# Patient Record
Sex: Female | Born: 1935 | Race: White | Hispanic: No | Smoking: Never smoker
Health system: Southern US, Community
[De-identification: ages and names within clinical notes are randomized; demographics above are authoritative.]

## PROBLEM LIST (undated history)

## (undated) DIAGNOSIS — E785 Hyperlipidemia, unspecified: Secondary | ICD-10-CM

## (undated) DIAGNOSIS — K635 Polyp of colon: Secondary | ICD-10-CM

## (undated) DIAGNOSIS — H269 Unspecified cataract: Secondary | ICD-10-CM

## (undated) DIAGNOSIS — F32A Depression, unspecified: Secondary | ICD-10-CM

## (undated) DIAGNOSIS — F209 Schizophrenia, unspecified: Secondary | ICD-10-CM

## (undated) DIAGNOSIS — E669 Obesity, unspecified: Secondary | ICD-10-CM

## (undated) DIAGNOSIS — N393 Stress incontinence (female) (male): Secondary | ICD-10-CM

## (undated) DIAGNOSIS — C801 Malignant (primary) neoplasm, unspecified: Secondary | ICD-10-CM

## (undated) DIAGNOSIS — R319 Hematuria, unspecified: Secondary | ICD-10-CM

## (undated) DIAGNOSIS — C4490 Unspecified malignant neoplasm of skin, unspecified: Secondary | ICD-10-CM

## (undated) DIAGNOSIS — M1990 Unspecified osteoarthritis, unspecified site: Secondary | ICD-10-CM

## (undated) DIAGNOSIS — I10 Essential (primary) hypertension: Secondary | ICD-10-CM

## (undated) DIAGNOSIS — F329 Major depressive disorder, single episode, unspecified: Secondary | ICD-10-CM

## (undated) DIAGNOSIS — Z9889 Other specified postprocedural states: Secondary | ICD-10-CM

## (undated) DIAGNOSIS — F419 Anxiety disorder, unspecified: Secondary | ICD-10-CM

## (undated) DIAGNOSIS — Z973 Presence of spectacles and contact lenses: Secondary | ICD-10-CM

## (undated) DIAGNOSIS — C50919 Malignant neoplasm of unspecified site of unspecified female breast: Secondary | ICD-10-CM

## (undated) DIAGNOSIS — N179 Acute kidney failure, unspecified: Secondary | ICD-10-CM

## (undated) DIAGNOSIS — K219 Gastro-esophageal reflux disease without esophagitis: Secondary | ICD-10-CM

## (undated) DIAGNOSIS — R32 Unspecified urinary incontinence: Secondary | ICD-10-CM

## (undated) DIAGNOSIS — Z9289 Personal history of other medical treatment: Secondary | ICD-10-CM

## (undated) HISTORY — DX: Unspecified malignant neoplasm of skin, unspecified: C44.90

## (undated) HISTORY — PX: TONSILLECTOMY: SUR1361

## (undated) HISTORY — DX: Malignant (primary) neoplasm, unspecified: C80.1

## (undated) HISTORY — DX: Stress incontinence (female) (male): N39.3

## (undated) HISTORY — PX: COLONOSCOPY: SHX174

## (undated) HISTORY — DX: Gastro-esophageal reflux disease without esophagitis: K21.9

## (undated) HISTORY — DX: Essential (primary) hypertension: I10

## (undated) HISTORY — DX: Hyperlipidemia, unspecified: E78.5

## (undated) HISTORY — DX: Unspecified urinary incontinence: R32

## (undated) HISTORY — DX: Unspecified cataract: H26.9

## (undated) HISTORY — PX: BREAST SURGERY: SHX581

## (undated) HISTORY — PX: SPIDER VEIN INJECTION: SHX783

## (undated) HISTORY — PX: TUBAL LIGATION: SHX77

## (undated) HISTORY — DX: Other specified postprocedural states: Z98.890

## (undated) HISTORY — DX: Malignant neoplasm of unspecified site of unspecified female breast: C50.919

## (undated) HISTORY — DX: Hematuria, unspecified: R31.9

## (undated) HISTORY — DX: Unspecified osteoarthritis, unspecified site: M19.90

## (undated) HISTORY — DX: Obesity, unspecified: E66.9

## (undated) HISTORY — DX: Polyp of colon: K63.5

## (undated) HISTORY — PX: APPENDECTOMY: SHX54

## (undated) HISTORY — DX: Presence of spectacles and contact lenses: Z97.3

## (undated) HISTORY — DX: Personal history of other medical treatment: Z92.89

## (undated) HISTORY — PX: TONSILECTOMY, ADENOIDECTOMY, BILATERAL MYRINGOTOMY AND TUBES: SHX2538

## (undated) HISTORY — DX: Schizophrenia, unspecified: F20.9

---

## 1999-08-10 ENCOUNTER — Other Ambulatory Visit: Admission: RE | Admit: 1999-08-10 | Discharge: 1999-08-10 | Payer: Self-pay | Admitting: Internal Medicine

## 1999-09-01 ENCOUNTER — Encounter: Admission: RE | Admit: 1999-09-01 | Discharge: 1999-09-01 | Payer: Self-pay | Admitting: Internal Medicine

## 1999-09-01 ENCOUNTER — Encounter: Payer: Self-pay | Admitting: Internal Medicine

## 2000-08-07 ENCOUNTER — Other Ambulatory Visit: Admission: RE | Admit: 2000-08-07 | Discharge: 2000-08-07 | Payer: Self-pay | Admitting: Family Medicine

## 2001-07-27 ENCOUNTER — Encounter: Payer: Self-pay | Admitting: Family Medicine

## 2001-07-27 ENCOUNTER — Encounter: Admission: RE | Admit: 2001-07-27 | Discharge: 2001-07-27 | Payer: Self-pay | Admitting: Family Medicine

## 2001-08-07 ENCOUNTER — Other Ambulatory Visit: Admission: RE | Admit: 2001-08-07 | Discharge: 2001-08-07 | Payer: Self-pay | Admitting: Family Medicine

## 2002-07-29 ENCOUNTER — Encounter: Payer: Self-pay | Admitting: Family Medicine

## 2002-07-29 ENCOUNTER — Encounter: Admission: RE | Admit: 2002-07-29 | Discharge: 2002-07-29 | Payer: Self-pay | Admitting: Family Medicine

## 2002-07-31 ENCOUNTER — Ambulatory Visit (HOSPITAL_COMMUNITY): Admission: RE | Admit: 2002-07-31 | Discharge: 2002-07-31 | Payer: Self-pay | Admitting: Gastroenterology

## 2002-07-31 ENCOUNTER — Encounter (INDEPENDENT_AMBULATORY_CARE_PROVIDER_SITE_OTHER): Payer: Self-pay | Admitting: *Deleted

## 2002-08-14 ENCOUNTER — Other Ambulatory Visit: Admission: RE | Admit: 2002-08-14 | Discharge: 2002-08-14 | Payer: Self-pay | Admitting: Family Medicine

## 2003-09-18 ENCOUNTER — Encounter: Admission: RE | Admit: 2003-09-18 | Discharge: 2003-09-18 | Payer: Self-pay | Admitting: Family Medicine

## 2003-12-15 ENCOUNTER — Other Ambulatory Visit: Admission: RE | Admit: 2003-12-15 | Discharge: 2003-12-15 | Payer: Self-pay | Admitting: Family Medicine

## 2007-02-01 ENCOUNTER — Other Ambulatory Visit: Admission: RE | Admit: 2007-02-01 | Discharge: 2007-02-01 | Payer: Self-pay | Admitting: Family Medicine

## 2007-07-02 ENCOUNTER — Encounter: Admission: RE | Admit: 2007-07-02 | Discharge: 2007-07-02 | Payer: Self-pay | Admitting: *Deleted

## 2010-03-02 ENCOUNTER — Other Ambulatory Visit: Admission: RE | Admit: 2010-03-02 | Discharge: 2010-03-02 | Payer: Self-pay | Admitting: Family Medicine

## 2010-10-03 DIAGNOSIS — Z9889 Other specified postprocedural states: Secondary | ICD-10-CM

## 2010-10-03 HISTORY — DX: Other specified postprocedural states: Z98.890

## 2011-02-18 NOTE — Op Note (Signed)
NAME:  Lorraine Murphy, Lorraine Murphy         ACCOUNT NO.:  [id number]   MEDICAL RECORD NO.:  [id number]                   PATIENT TYPE:  AMB   LOCATION:  ENDO                                 FACILITY:  [facility]   PHYSICIAN:  Bernette Redbird, MD                  DATE OF BIRTH:  1934/11/04   DATE OF PROCEDURE:  07/31/2002  DATE OF DISCHARGE:                                 OPERATIVE REPORT   PROCEDURE:  Colonoscopy with biopsy.   INDICATION:  Follow-up of colon adenoma removed five years ago in a 76-year-  old female.   FINDINGS:  Diminutive polyp above the cecum.   DESCRIPTION OF PROCEDURE:  The nature, purpose, and risks of the procedure  had previously been discussed with the patient from her prior procedure, and  the provided written consent.  Sedation was fentanyl 75 mcg and Versed 8 mg  IV without arrhythmias or desaturation.  The procedure was initiated with  the Olympus pediatric video colonoscope, but this could not be advanced  beyond the level of the ileocecal valve due to looping, so we took the scope  out, looking as we came, observing no abnormalities, and the patient was  recolonoscoped using the adult video colonoscope which, with the patient in  the supine position and a fair amount of effort, was finally able to reach  the base of the cecum with complete visualization of the appendiceal orifice  and the base of the cecum.  Pullback was then performed.  The quality of the  prep was excellent, and it is felt that essentially all areas were well  seen.  There was a little bit of particulate stool debris here and there,  but it is not felt that any significant lesions would have been missed.   There was a sessile 2 mm polyp on the fold just above the cecum removed by a  single cold biopsy.  No other polyps were seen, and there was no evidence of  cancer, colitis, vascular malformations, or diverticular disease.  Retroflexion in the rectum was unremarkable, and the  patient tolerated the  procedure well.  There were no apparent complications.   IMPRESSION:  Solitary diminutive sessile polyp, removed as described above.  Otherwise normal exam in a patient with a prior history of colonic adenomas.   PLAN:  Await pathology on the polyp.  Anticipate colonoscopic reevaluation  in five years in view of the prior history of a colonic adenoma having been  removed.                                               Bernette Redbird, MD    RB/MEDQ  D:  07/31/2002  T:  07/31/2002  Job:  [id number]   cc:   Raynelle Dick, M.D.  [address]  [place]  [place] [address]  Fax: [phone]

## 2012-02-09 ENCOUNTER — Other Ambulatory Visit: Payer: Self-pay | Admitting: Radiology

## 2012-02-13 ENCOUNTER — Other Ambulatory Visit: Payer: Self-pay | Admitting: Radiology

## 2012-02-13 DIAGNOSIS — C50911 Malignant neoplasm of unspecified site of right female breast: Secondary | ICD-10-CM

## 2012-02-16 ENCOUNTER — Telehealth: Payer: Self-pay | Admitting: *Deleted

## 2012-02-16 ENCOUNTER — Other Ambulatory Visit: Payer: Self-pay | Admitting: *Deleted

## 2012-02-16 DIAGNOSIS — C50419 Malignant neoplasm of upper-outer quadrant of unspecified female breast: Secondary | ICD-10-CM

## 2012-02-16 DIAGNOSIS — C50411 Malignant neoplasm of upper-outer quadrant of right female breast: Secondary | ICD-10-CM | POA: Insufficient documentation

## 2012-02-16 NOTE — Telephone Encounter (Signed)
Confirmed [facility] for 02/22/12 at 1200 .  Instructions and contact information given.  

## 2012-02-17 ENCOUNTER — Ambulatory Visit
Admission: RE | Admit: 2012-02-17 | Discharge: 2012-02-17 | Disposition: A | Discharge status: Home / Self Care | Payer: Medicare Other | Source: Ambulatory Visit

## 2012-02-17 ENCOUNTER — Ambulatory Visit
Admission: RE | Admit: 2012-02-17 | Discharge: 2012-02-17 | Disposition: A | Discharge status: Home / Self Care | Payer: Medicare Other | Source: Ambulatory Visit | Attending: Radiology | Admitting: Radiology

## 2012-02-17 DIAGNOSIS — C50911 Malignant neoplasm of unspecified site of right female breast: Secondary | ICD-10-CM

## 2012-02-17 MED ORDER — GADOBENATE DIMEGLUMINE 529 MG/ML IV SOLN
20.0000 mL | Freq: Once | INTRAVENOUS | Status: AC | PRN
  Administered 2012-02-17: 20 mL via INTRAVENOUS

## 2012-02-22 ENCOUNTER — Encounter (INDEPENDENT_AMBULATORY_CARE_PROVIDER_SITE_OTHER): Payer: Self-pay | Admitting: General Surgery

## 2012-02-22 ENCOUNTER — Encounter: Payer: Self-pay | Admitting: Specialist

## 2012-02-22 ENCOUNTER — Ambulatory Visit
Admission: RE | Admit: 2012-02-22 | Discharge: 2012-02-22 | Disposition: A | Discharge status: Home / Self Care | Payer: Medicare Other | Source: Ambulatory Visit | Attending: Radiation Oncology | Admitting: Radiation Oncology

## 2012-02-22 ENCOUNTER — Other Ambulatory Visit (HOSPITAL_BASED_OUTPATIENT_CLINIC_OR_DEPARTMENT_OTHER): Payer: Medicare Other

## 2012-02-22 ENCOUNTER — Ambulatory Visit (HOSPITAL_BASED_OUTPATIENT_CLINIC_OR_DEPARTMENT_OTHER): Payer: Medicare Other | Admitting: General Surgery

## 2012-02-22 ENCOUNTER — Ambulatory Visit: Payer: Medicare Other

## 2012-02-22 ENCOUNTER — Ambulatory Visit (HOSPITAL_BASED_OUTPATIENT_CLINIC_OR_DEPARTMENT_OTHER): Payer: Medicare Other | Admitting: Oncology

## 2012-02-22 ENCOUNTER — Encounter: Payer: Self-pay | Admitting: *Deleted

## 2012-02-22 ENCOUNTER — Encounter: Payer: Self-pay | Admitting: Oncology

## 2012-02-22 ENCOUNTER — Ambulatory Visit: Payer: Medicare Other | Attending: General Surgery | Admitting: Physical Therapy

## 2012-02-22 VITALS — BP 133/81 | HR 71 | Temp 98.6°F | Ht 63.5 in | Wt 246.9 lb

## 2012-02-22 DIAGNOSIS — C50419 Malignant neoplasm of upper-outer quadrant of unspecified female breast: Secondary | ICD-10-CM

## 2012-02-22 DIAGNOSIS — C50919 Malignant neoplasm of unspecified site of unspecified female breast: Secondary | ICD-10-CM

## 2012-02-22 DIAGNOSIS — IMO0001 Reserved for inherently not codable concepts without codable children: Secondary | ICD-10-CM | POA: Insufficient documentation

## 2012-02-22 DIAGNOSIS — R293 Abnormal posture: Secondary | ICD-10-CM | POA: Insufficient documentation

## 2012-02-22 LAB — COMPREHENSIVE METABOLIC PANEL
ALT: 19 U/L (ref 0–35)
Albumin: 3.7 g/dL (ref 3.5–5.2)
Alkaline Phosphatase: 89 U/L (ref 39–117)
Glucose, Bld: 119 mg/dL — ABNORMAL HIGH (ref 70–99)
Potassium: 3.8 mEq/L (ref 3.5–5.3)
Sodium: 139 mEq/L (ref 135–145)
Total Bilirubin: 0.2 mg/dL — ABNORMAL LOW (ref 0.3–1.2)
Total Protein: 6.8 g/dL (ref 6.0–8.3)

## 2012-02-22 LAB — CBC WITH DIFFERENTIAL/PLATELET
Basophils Absolute: 0 10*3/uL (ref 0.0–0.1)
EOS%: 3.5 % (ref 0.0–7.0)
Eosinophils Absolute: 0.3 10*3/uL (ref 0.0–0.5)
HGB: 12.6 g/dL (ref 11.6–15.9)
MCH: 28.2 pg (ref 25.1–34.0)
MCV: 87.4 fL (ref 79.5–101.0)
MONO%: 9.2 % (ref 0.0–14.0)
NEUT#: 4.5 10*3/uL (ref 1.5–6.5)
RBC: 4.46 10*6/uL (ref 3.70–5.45)
RDW: 15.7 % — ABNORMAL HIGH (ref 11.2–14.5)
lymph#: 3.1 10*3/uL (ref 0.9–3.3)
nRBC: 0 % (ref 0–0)

## 2012-02-22 LAB — CANCER ANTIGEN 27.29: CA 27.29: 38 U/mL (ref 0–39)

## 2012-02-22 NOTE — Progress Notes (Signed)
ID: Lorraine Murphy   DOB: 1935-03-02  MR#: [id number]  HQI#:[id number]  HISTORY OF PRESENT ILLNESS: Lorraine Murphy (pronoiunced "Goin") palpated a mass in her right breast about a week before routine mammography was due 02/07/2012. She mentioned this at [facility] so her screening mammography was changed to diagnostic, and this showed a small well circumscribed mass in the right breast, without associated microcalcifications. This was palpable and mobile by exam. Ultrasound showed a hypoechoic lobulated mass measuring 1.0 cm. Core biopsy was performed May 9 and showed ([id number]) and invasive ductal carcinoma, grade 1, with a prognostic panel pending. Bilateral breast MRI was performed 02/17/2012 and confirmed an enhancing irregular mass measuring 1.3 cm in the right breast, with a 0.6 cm mass medial to that. There was no enlarged axillary or internal mammary adenopathy. The patient subsequent history is as detailed below.  INTERVAL HISTORY: Lorraine Murphy presents today to the multidisciplinary breast cancer clinic with her sons Chrissie Noa and Molli Hazard for a Toni Arthurs discussion of her situation and a definitive treatment plan.  REVIEW OF SYSTEMS: She reports no symptoms suggestive of metastatic disease, and in particular her functional status has been very stable. She complains of poor circulation, but denies chest pain or pressure. She has chronic urinary incontinence. She has arthritis involving several joints, and this makes it difficult for her to walk long distances, but none of this is new or more intense or persistent than previously. Overall a detailed review of systems today was noncontributory  PAST MEDICAL HISTORY: Past Medical History  Diagnosis Date  . Hypertension   . Breast cancer   . Diabetes mellitus   . Wears glasses   . H/O colonoscopy 2012  . H/O bone density study   . Incontinence   . Arthritis   . Skin cancer   . Schizophrenia   . GERD (gastroesophageal reflux disease)   . Obesity   .  Osteoarthritis   . Bilateral cataracts     PAST SURGICAL HISTORY: Past Surgical History  Procedure Date  . Appendectomy   . Tonsilectomy, adenoidectomy, bilateral myringotomy and tubes   . Tubal ligation   . Spider vein injection     FAMILY HISTORY Family History  Problem Relation Age of Onset  . Lung cancer Father    the patient's father died at the age of 74, shortly after being diagnosed with lung cancer, in the setting of tobacco abuse. The patient's mother died at the age of 37, possibly from heart disease. The patient had 2 brothers. One of them died in infancy. Of the other has a history of skin cancers, not melanoma. The patient had one sister who died from complications of emphysema. There is no history of breast or ovarian cancer in the family.  GYNECOLOGIC HISTORY: Menarche age 26, first live birth age 48, she is GX P2, went through menopause approximately age 56, and used hormone replacement for 10-15 years, stopping several years ago.   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 She is divorced and lives at home with her beagle "Cossey ." Her some [name] is a IT trainer and lives in [place]. Son [name] is a district at [employer] in [place]. The patient is a [employer]. She has 1 grandchild  ADVANCED DIRECTIVES: No advanced directives in place.  HEALTH MAINTENANCE: History  Substance Use Topics  . Smoking status: Never Smoker   . Smokeless tobacco: Not on file  . Alcohol Use: Yes  Colonoscopy: 2012  PAP: 2010?  Bone density: remote, "good"  Lipid panel:  No Known Allergies  Current Outpatient Prescriptions  Medication Sig Dispense Refill  . amLODipine (NORVASC) 5 MG tablet Take 5 mg by mouth daily.      Marland Kitchen aspirin 81 MG tablet Take 81 mg by mouth daily.      Marland Kitchen atorvastatin (LIPITOR) 20 MG tablet       . fish oil-omega-3 fatty acids 1000 MG capsule Take 2 g by mouth daily.      .  haloperidol decanoate (HALDOL DECANOATE) 50 MG/ML injection       . losartan-hydrochlorothiazide (HYZAAR) 50-12.5 MG per tablet       . Multiple Vitamin (MULTIVITAMIN) tablet Take 1 tablet by mouth daily.        OBJECTIVE: Middle-aged white woman in no acute distress Filed Vitals:   02/22/12 1308  BP: 133/81  Pulse: 71  Temp: 98.6 F (37 C)     Body mass index is 43.05 kg/(m^2).    ECOG FS: 1  Sclerae unicteric Oropharynx clear No cervical or supraclavicular adenopathy Lungs no rales or rhonchi Heart regular rate and rhythm Abd benign MSK no focal spinal tenderness Neuro: nonfocal; normal affect, alert and oriented x3 Breasts: The right breast is status post recent biopsy, and there is a small ecchymosis related to this. The mass is superficial in the right breast, near the nipple, easily movable, without other skin or nipple changes. The left breast is unremarkable.  LAB RESULTS: Lab Results  Component Value Date   WBC 8.8 02/22/2012   NEUTROABS 4.5 02/22/2012   HGB 12.6 02/22/2012   HCT 39.0 02/22/2012   MCV 87.4 02/22/2012   PLT 335 02/22/2012      Chemistry      Component Value Date/Time   NA 139 02/22/2012 1255   K 3.8 02/22/2012 1255   CL 103 02/22/2012 1255   CO2 27 02/22/2012 1255   BUN 18 02/22/2012 1255   CREATININE 0.92 02/22/2012 1255      Component Value Date/Time   CALCIUM 10.0 02/22/2012 1255   ALKPHOS 89 02/22/2012 1255   AST 20 02/22/2012 1255   ALT 19 02/22/2012 1255   BILITOT 0.2* 02/22/2012 1255       No results found for this basename: LABCA2    No components found with this basename: LABCA125    No results found for this basename: INR:1;PROTIME:1 in the last 168 hours  Urinalysis No results found for this basename: colorurine,  appearanceur,  labspec,  phurine,  glucoseu,  hgbur,  bilirubinur,  ketonesur,  proteinur,  urobilinogen,  nitrite,  leukocytesur    STUDIES: Mr Breast Bilateral W Wo Contrast  02/20/2012  *RADIOLOGY REPORT*  Clinical  Data: Recently diagnosed invasive ductal carcinoma from the 9 o'clock position of the right breast.  BILATERAL BREAST MRI WITH AND WITHOUT CONTRAST  Technique: Multiplanar, multisequence MR images of both breasts were obtained prior to and following the intravenous administration of 20ml of multihance.  Three dimensional images were evaluated at the independent DynaCad workstation.  Comparison:  Mammograms from [facility] dated 02/09/2012, 02/07/2012 and 02/03/2011  Findings: There is a moderate nodular background parenchymal enhancement pattern.  In the superficial 9 o'clock region of the right breast there is an enhancing irregular mass measuring 1.3 x 0.6 x 0.8 cm associated with a clip artifact that is consistent with the known malignancy.  1.2 cm medial to this mass there is an enhancing ovoid 0.6 x  0.2 x 0.4 cm mass.  No enlarged axillary or internal mammary adenopathy is detected.  There is no abnormal enhancement the left breast.  IMPRESSION:  1.  1.3 cm irregular enhancing mass in the 9 o'clock region of the right breast corresponding well with the known malignancy. Treatment planning is recommended. 2.  Medial to known malignancy is a second enhancing mass for which second look ultrasound is recommended.  If the lesion is not seen sonographically MRI guided core biopsy would be suggested.  THREE-DIMENSIONAL MR IMAGE RENDERING ON INDEPENDENT WORKSTATION:  Three-dimensional MR images were rendered by post-processing of the original MR data on an independent workstation.  The three- dimensional MR images were interpreted, and findings were reported in the accompanying complete MRI report for this study.  BI-RADS CATEGORY 4:  Suspicious abnormality - biopsy should be considered.  Original Report Authenticated By: Littie Deeds. Judyann Munson, M.D.    ASSESSMENT: 77 year old [place] woman status post right breast biopsy 02/09/2012 for a clinical T1C N0, stage IA invasive ductal carcinoma, grade 1, with a  prognostic panel pending. There is a 0.6 cm mass adjacent to the biopsied mass just described. It has not been biopsied.  PLAN: We discussed her situation in detail, and our feeling is that the patient is a very good candidate for breast conservation therapy. If her tumor is estrogen receptor positive, she will benefit from antiestrogen therapy. This will not only reduce the risk of this cancer recurring, but also reduce the risk of any new breast cancer developing, and may allow her to forego receiving radiation treatments. I gave the patient some information regarding anastrozole, which is what we would likely use is a prognostic panel turns as we expect.  The plan accordingly is to proceed to surgery. She will see me in approximately 4 weeks. At that time we should be able to make a definitive treatment plan. In the meantime she knows to call if any other questions or concerns arise.   Peter Keyworth C    02/22/2012

## 2012-02-22 NOTE — Progress Notes (Signed)
Radiation Oncology         [phone]) [phone] ________________________________  Initial outpatient Consultation  Name: Lorraine Murphy MRN: [id number]  Date: 02/22/2012  DOB: August 30, 1935  CC: Dr. Emelia Loron, Dr. Raymond Gurney Magrinat  REFERRING PHYSICIAN: ; Emelia Loron  DIAGNOSIS: Invasive ductal carcinoma of the right breast  HISTORY OF PRESENT ILLNESS::Lorraine Murphy is a 77 y.o. female who is seen as part of the multidisciplinary breast clinic. Earlier this year the patient palpated a small nodule in the lateral aspect of the breast on self-examination. She denies any actual pain in the breast area nipple discharge or bleeding. Patient was seen at the [facility]. Biopsy was performed which revealed invasive ductal carcinoma. Prognostic indicators are pending at this time. Patient did undergo a MRI which showed the lesion and measured 1.3 cm in the 9:30  position of the right breast. Medial to the known lesion was ovoid mass which second look ultrasound was recommended. This ultrasound was performed and there was no sonographic correlation. This was noted to have a benign-appearing lesion in the immediate area. With this information the patient is now seen as part of the multidisciplinary breast clinic.    PREVIOUS RADIATION THERAPY: No  PAST MEDICAL HISTORY:     PAST SURGICAL HISTORY tonsillectomy appendectomy and tubal ligation. this is also had laser vein surgery.  FAMILY HISTORY: No family history of breast or ovarian cancer. There is a family history of brain tumors.  SOCIAL HISTORY:  does not have a smoking history on file. She does not have any smokeless tobacco history on file.  ALLERGIES: Review of patient's allergies indicates no known allergies.  MEDICATIONS:  Haldol atorvastatin aspirin  REVIEW OF SYSTEMS:  A 15 point review of systems is documented in the electronic medical record. This was obtained by the nursing staff. However, I reviewed this with the patient  to discuss relevant findings and make appropriate changes.  She specifically denies any pain the breast area nipple discharge or bleeding prior to diagnosis. Patient denies any new bony pain headaches dizziness or blurred vision.   PHYSICAL EXAM:  In general this is a very pleasant elderly female in no acute distress. She is accompanied by her2 sons on evaluation today. Examination of the neck and supraclavicular region reveals no evidence of adenopathy. The axillary areas are free of adenopathy. Examination of the lungs reveals them to be clear. The heart has regular rhythm and rate. Examination of left breast reveals the large and pendulous without mass or nipple discharge. Examination of right breast reveals a small  8-9 mm firm nodule in the superficial aspect of the right breast. Approximately 7-8 cm from the nipple areolar border. I am unable to palpate any other suspicious area in the right breast.   LABORATORY DATA:  Lab Results  Component Value Date   WBC 8.8 02/22/2012   HGB 12.6 02/22/2012   HCT 39.0 02/22/2012   MCV 87.4 02/22/2012   PLT 335 02/22/2012   No results found for this basename: NA, K, CL, CO2   No results found for this basename: ALT, AST, GGT, ALKPHOS, BILITOT     RADIOGRAPHY: Mr Breast Bilateral W Wo Contrast  02/20/2012  *RADIOLOGY REPORT*  Clinical Data: Recently diagnosed invasive ductal carcinoma from the 9 o'clock position of the right breast.  BILATERAL BREAST MRI WITH AND WITHOUT CONTRAST  Technique: Multiplanar, multisequence MR images of both breasts were obtained prior to and following the intravenous administration of 20ml of multihance.  Three  dimensional images were evaluated at the independent DynaCad workstation.  Comparison:  Mammograms from [facility] dated 02/09/2012, 02/07/2012 and 02/03/2011  Findings: There is a moderate nodular background parenchymal enhancement pattern.  In the superficial 9 o'clock region of the right breast there is an  enhancing irregular mass measuring 1.3 x 0.6 x 0.8 cm associated with a clip artifact that is consistent with the known malignancy.  1.2 cm medial to this mass there is an enhancing ovoid 0.6 x 0.2 x 0.4 cm mass.  No enlarged axillary or internal mammary adenopathy is detected.  There is no abnormal enhancement the left breast.  IMPRESSION:  1.  1.3 cm irregular enhancing mass in the 9 o'clock region of the right breast corresponding well with the known malignancy. Treatment planning is recommended. 2.  Medial to known malignancy is a second enhancing mass for which second look ultrasound is recommended.  If the lesion is not seen sonographically MRI guided core biopsy would be suggested.  THREE-DIMENSIONAL MR IMAGE RENDERING ON INDEPENDENT WORKSTATION:  Three-dimensional MR images were rendered by post-processing of the original MR data on an independent workstation.  The three- dimensional MR images were interpreted, and findings were reported in the accompanying complete MRI report for this study.  BI-RADS CATEGORY 4:  Suspicious abnormality - biopsy should be considered.  Original Report Authenticated By: Littie Deeds. Judyann Munson, M.D.      IMPRESSION: Invasive ductal carcinoma of the right breast. The patient appears to have a small superficial lesion in the 9:30 position of the right breast approximately 7-8 cm from the areolar border. I discussed 2 options to consider including partial mastectomy versus mastectomy. We also discussed adjuvant treatment including hormone therapy and possibly radiation therapy as part of her overall management. At this point patient appears to be leaning towards breast conservation therapy. She does understand that she will be to have assessment of this other questionable area which may be surgically removed at the time of her planned lumpectomy. Final details concerning surgical management management will depend on Dr. Doreen Salvage assessment.        ------------------------------------------------    Billie Lade, PhD, MD

## 2012-02-22 NOTE — Progress Notes (Signed)
I met the patient and her two sons in the multidisciplinary breast clinic today.  She seemed to have good support.  I told her about the available support services and will also make a referral for her to [facility].

## 2012-02-22 NOTE — Progress Notes (Signed)
Patient ID: Lorraine Murphy, female   DOB: 22-Jul-1935, 77 y.o.   MRN: [id number]  Chief Complaint  Patient presents with  . Other    breast cancer    HPI Lorraine Murphy is a 77 y.o. female.  Referred by Dr. Rogelia Mire HPI 47 yof who noted a right breast mass while lying down about a month ago.  She notes no other complaints referable to her breasts.  Her mammogram showed in the right breast a small well circumscribed mass.  Ultrasound shows a .6x1 cm mass.  US guided core biopsy was performed.  MRI was performed showing a 1.3x0.6x0.8 cm mass.  There is a 1.2 cm medial to this mas an enhancing small mass.  There is no abnormal adenopathy or anything abnormal in the left breast.  An ultrasound was performed showing a benign lesion near the cancer but nothing that correlates with the MR finding.  Neither of these is concerning to radiologists at conference this morning.   Her biopsy shows invasive ductal carcinoma, prognostic panel pending.  Past Medical History  Diagnosis Date  . Hypertension   . Breast cancer   . Diabetes mellitus   . Wears glasses   . H/O colonoscopy 2012  . H/O bone density study   . Incontinence   . Arthritis   . Skin cancer     Past Surgical History  Procedure Date  . Appendectomy   . Tonsilectomy, adenoidectomy, bilateral myringotomy and tubes   . Tubal ligation   . Spider vein injection     Family History  Problem Relation Age of Onset  . Lung cancer Father     Social History History  Substance Use Topics  . Smoking status: Never Smoker   . Smokeless tobacco: Not on file  . Alcohol Use: Yes    No Known Allergies  Current Outpatient Prescriptions  Medication Sig Dispense Refill  . amLODipine (NORVASC) 5 MG tablet Take 5 mg by mouth daily.      Marland Kitchen aspirin 81 MG tablet Take 81 mg by mouth daily.      Marland Kitchen atorvastatin (LIPITOR) 20 MG tablet       . fish oil-omega-3 fatty acids 1000 MG capsule Take 2 g by mouth daily.      . haloperidol decanoate  (HALDOL DECANOATE) 50 MG/ML injection       . losartan-hydrochlorothiazide (HYZAAR) 50-12.5 MG per tablet       . Multiple Vitamin (MULTIVITAMIN) tablet Take 1 tablet by mouth daily.        Review of Systems Review of Systems  Constitutional: Negative for fever, chills and unexpected weight change.  HENT: Negative for hearing loss, congestion, sore throat, trouble swallowing and voice change.   Eyes: Negative for visual disturbance.  Respiratory: Negative for cough and wheezing.   Cardiovascular: Negative for chest pain, palpitations and leg swelling.  Gastrointestinal: Negative for nausea, vomiting, abdominal pain, diarrhea, constipation, blood in stool, abdominal distention and anal bleeding.  Genitourinary: Negative for hematuria, vaginal bleeding and difficulty urinating.  Musculoskeletal: Positive for arthralgias.  Skin: Negative for rash and wound.  Neurological: Negative for seizures, syncope and headaches.  Hematological: Negative for adenopathy. Does not bruise/bleed easily.  Psychiatric/Behavioral: Negative for confusion.    There were no vitals taken for this visit.  Physical Exam Physical Exam  Vitals reviewed. Constitutional: She appears well-developed and well-nourished.  Neck: Neck supple.  Cardiovascular: Normal rate, regular rhythm and normal heart sounds.   Pulmonary/Chest: Effort normal and breath  sounds normal. She has no wheezes. She has no rales. Right breast exhibits mass. Right breast exhibits no inverted nipple, no nipple discharge, no skin change and no tenderness. Left breast exhibits no inverted nipple, no mass, no nipple discharge, no skin change and no tenderness. Breasts are symmetrical.    Lymphadenopathy:    She has no cervical adenopathy.    Data Reviewed BILATERAL BREAST MRI WITH AND WITHOUT CONTRAST  Technique: Multiplanar, multisequence MR images of both breasts  were obtained prior to and following the intravenous administration  of 20ml  of multihance. Three dimensional images were evaluated at  the independent DynaCad workstation.  Comparison: Mammograms from [facility] dated 02/09/2012,  02/07/2012 and 02/03/2011  Findings: There is a moderate nodular background parenchymal  enhancement pattern. In the superficial 9 o'clock region of the  right breast there is an enhancing irregular mass measuring 1.3 x  0.6 x 0.8 cm associated with a clip artifact that is consistent  with the known malignancy. 1.2 cm medial to this mass there is an  enhancing ovoid 0.6 x 0.2 x 0.4 cm mass.  No enlarged axillary or internal mammary adenopathy is detected.  There is no abnormal enhancement the left breast.  IMPRESSION:  1. 1.3 cm irregular enhancing mass in the 9 o'clock region of the  right breast corresponding well with the known malignancy.  Treatment planning is recommended.  2. Medial to known malignancy is a second enhancing mass for which  second look ultrasound is recommended. If the lesion is not seen  sonographically MRI guided core biopsy would be suggested.   Assessment    Left breast cancer    Plan   Left breast lumpectomy  We discussed the staging and pathophysiology of breast cancer. We discussed all of the different options for treatment for breast cancer including surgery, chemotherapy, radiation therapy, Herceptin, and antiestrogen therapy.  We will likely not do a sentinel node biopsy as both medical and radiation oncology do not think we will need for treatment. Will await prognostic panel and then discuss with them. We discussed the options for treatment of the breast cancer which included lumpectomy versus a mastectomy. We discussed the performance of the lumpectomy with a wire placement. We discussed a 10-20% chance of a positive margin requiring reexcision in the operating room. We also discussed that she may need radiation therapy or antiestrogen therapy or both if she undergoes lumpectomy. We  discussed the mastectomy and the postoperative care for that as well. We discussed that there is no difference in her survival whether she undergoes lumpectomy with radiation therapy or antiestrogen therapy versus a mastectomy. There is a slight difference in the local recurrence rate being 3-5% with lumpectomy and about 1% with a mastectomy. We discussed the risks of operation including bleeding, infection, possible reoperation. She understands her further therapy will be based on what her stages at the time of her operation.        Ziere Docken 02/22/2012, 5:17 PM

## 2012-02-23 ENCOUNTER — Telehealth: Payer: Self-pay | Admitting: *Deleted

## 2012-02-23 NOTE — Telephone Encounter (Signed)
Spoke to pt concerning [facility] from 02/22/12.  Informed pt of prognostic panel of ER 90%, PR 5%, Ki67 5%, and Her2neu negative.  Pt denies questions or concerns regarding dx or treatment care plan.  Encourage pt to call with needs.  Contact information given.

## 2012-02-24 ENCOUNTER — Encounter: Payer: Self-pay | Admitting: *Deleted

## 2012-02-24 ENCOUNTER — Other Ambulatory Visit: Payer: Self-pay | Admitting: Oncology

## 2012-02-24 ENCOUNTER — Telehealth: Payer: Self-pay | Admitting: *Deleted

## 2012-02-24 ENCOUNTER — Encounter (HOSPITAL_BASED_OUTPATIENT_CLINIC_OR_DEPARTMENT_OTHER): Payer: Self-pay | Admitting: *Deleted

## 2012-02-24 NOTE — Telephone Encounter (Signed)
Returned pt's call requesting pathology results.   Let her know that tumor was hormone recptive.  That she does not need chemotherapy at this point and Dr. Darnelle Catalan recommends that she proceed with surgery.  She is clinically a stage I at this point.  That could possibly change after surgery.   Pt. States that this is her understanding and she appreciated the call back.

## 2012-02-24 NOTE — Telephone Encounter (Signed)
confirmed over the phone the date and time on 03-29-2012 at 2:30pm

## 2012-02-24 NOTE — Progress Notes (Signed)
Mailed after appt letter to pt. 

## 2012-02-28 ENCOUNTER — Encounter: Payer: Self-pay | Admitting: Oncology

## 2012-02-28 ENCOUNTER — Encounter (HOSPITAL_BASED_OUTPATIENT_CLINIC_OR_DEPARTMENT_OTHER)
Admission: RE | Admit: 2012-02-28 | Discharge: 2012-02-28 | Disposition: A | Discharge status: Home / Self Care | Payer: Medicare Other | Source: Ambulatory Visit | Attending: General Surgery | Admitting: General Surgery

## 2012-02-28 ENCOUNTER — Other Ambulatory Visit: Payer: Self-pay | Admitting: Oncology

## 2012-02-28 ENCOUNTER — Telehealth: Payer: Self-pay | Admitting: *Deleted

## 2012-02-28 LAB — BASIC METABOLIC PANEL
BUN: 13 mg/dL (ref 6–23)
CO2: 25 mEq/L (ref 19–32)
Chloride: 106 mEq/L (ref 96–112)
Glucose, Bld: 96 mg/dL (ref 70–99)
Potassium: 4.1 mEq/L (ref 3.5–5.1)

## 2012-02-28 NOTE — Progress Notes (Signed)
Patient came by today with her medicaid application, I helped her complete her insurance section and I advised the patient to take her application to the Social Services Department on [address].

## 2012-02-28 NOTE — Telephone Encounter (Signed)
Spoke to pt concerning [facility] from 5/22.  Pt denies questions or concerns regarding dx or treatment care plan.  Encourage pt to call with needs.  Confirmed surgery date and f/u with Dr. Darnelle Catalan.  Contact information given.

## 2012-02-29 ENCOUNTER — Encounter (HOSPITAL_BASED_OUTPATIENT_CLINIC_OR_DEPARTMENT_OTHER)
Admission: RE | Disposition: A | Discharge status: Home / Self Care | Payer: Self-pay | Source: Ambulatory Visit | Attending: General Surgery

## 2012-02-29 ENCOUNTER — Ambulatory Visit (HOSPITAL_BASED_OUTPATIENT_CLINIC_OR_DEPARTMENT_OTHER)
Admission: RE | Admit: 2012-02-29 | Discharge: 2012-02-29 | Disposition: A | Discharge status: Home / Self Care | Payer: Medicare Other | Source: Ambulatory Visit | Attending: General Surgery | Admitting: General Surgery

## 2012-02-29 ENCOUNTER — Ambulatory Visit (HOSPITAL_BASED_OUTPATIENT_CLINIC_OR_DEPARTMENT_OTHER): Payer: Medicare Other | Admitting: Anesthesiology

## 2012-02-29 ENCOUNTER — Encounter (HOSPITAL_BASED_OUTPATIENT_CLINIC_OR_DEPARTMENT_OTHER): Payer: Self-pay | Admitting: Anesthesiology

## 2012-02-29 ENCOUNTER — Encounter (HOSPITAL_BASED_OUTPATIENT_CLINIC_OR_DEPARTMENT_OTHER): Payer: Self-pay

## 2012-02-29 DIAGNOSIS — E119 Type 2 diabetes mellitus without complications: Secondary | ICD-10-CM | POA: Insufficient documentation

## 2012-02-29 DIAGNOSIS — F411 Generalized anxiety disorder: Secondary | ICD-10-CM | POA: Insufficient documentation

## 2012-02-29 DIAGNOSIS — C50919 Malignant neoplasm of unspecified site of unspecified female breast: Secondary | ICD-10-CM

## 2012-02-29 DIAGNOSIS — F209 Schizophrenia, unspecified: Secondary | ICD-10-CM | POA: Insufficient documentation

## 2012-02-29 DIAGNOSIS — I10 Essential (primary) hypertension: Secondary | ICD-10-CM | POA: Insufficient documentation

## 2012-02-29 DIAGNOSIS — Z01810 Encounter for preprocedural cardiovascular examination: Secondary | ICD-10-CM | POA: Insufficient documentation

## 2012-02-29 DIAGNOSIS — K219 Gastro-esophageal reflux disease without esophagitis: Secondary | ICD-10-CM | POA: Insufficient documentation

## 2012-02-29 HISTORY — DX: Anxiety disorder, unspecified: F41.9

## 2012-02-29 HISTORY — DX: Malignant neoplasm of unspecified site of unspecified female breast: C50.919

## 2012-02-29 SURGERY — BREAST LUMPECTOMY WITH NEEDLE LOCALIZATION
Anesthesia: General | Site: Breast | Laterality: Right | Wound class: Clean

## 2012-02-29 MED ORDER — LORAZEPAM 2 MG/ML IJ SOLN: 1.0000 mg | Freq: Once | INTRAMUSCULAR | Status: DC | PRN

## 2012-02-29 MED ORDER — SCOPOLAMINE 1 MG/3DAYS TD PT72: 1.0000 | MEDICATED_PATCH | Freq: Once | TRANSDERMAL | Status: DC

## 2012-02-29 MED ORDER — LACTATED RINGERS IV SOLN
INTRAVENOUS | Status: DC
  Administered 2012-02-29: 14:00:00 via INTRAVENOUS

## 2012-02-29 MED ORDER — OXYCODONE-ACETAMINOPHEN 5-325 MG PO TABS: 1.0000 | ORAL_TABLET | ORAL | Status: AC | PRN

## 2012-02-29 MED ORDER — MIDAZOLAM HCL 2 MG/2ML IJ SOLN: 1.0000 mg | INTRAMUSCULAR | Status: DC | PRN

## 2012-02-29 MED ORDER — FENTANYL CITRATE 0.05 MG/ML IJ SOLN: 50.0000 ug | INTRAMUSCULAR | Status: DC | PRN

## 2012-02-29 MED ORDER — HYDROMORPHONE HCL PF 1 MG/ML IJ SOLN
0.2500 mg | INTRAMUSCULAR | Status: DC | PRN
  Administered 2012-02-29: 0.5 mg via INTRAVENOUS

## 2012-02-29 MED ORDER — EPHEDRINE SULFATE 50 MG/ML IJ SOLN
INTRAMUSCULAR | Status: DC | PRN
  Administered 2012-02-29: 10 mg via INTRAVENOUS

## 2012-02-29 MED ORDER — FENTANYL CITRATE 0.05 MG/ML IJ SOLN
INTRAMUSCULAR | Status: DC | PRN
  Administered 2012-02-29 (×2): 50 ug via INTRAVENOUS

## 2012-02-29 MED ORDER — SCOPOLAMINE 1 MG/3DAYS TD PT72
1.0000 | MEDICATED_PATCH | Freq: Once | TRANSDERMAL | Status: DC
  Administered 2012-02-29: 1.5 mg via TRANSDERMAL

## 2012-02-29 MED ORDER — LIDOCAINE HCL (CARDIAC) 20 MG/ML IV SOLN
INTRAVENOUS | Status: DC | PRN
  Administered 2012-02-29: 75 mg via INTRAVENOUS

## 2012-02-29 MED ORDER — PROPOFOL 10 MG/ML IV EMUL
INTRAVENOUS | Status: DC | PRN
  Administered 2012-02-29: 200 mg via INTRAVENOUS

## 2012-02-29 MED ORDER — ONDANSETRON HCL 4 MG/2ML IJ SOLN
INTRAMUSCULAR | Status: DC | PRN
  Administered 2012-02-29: 4 mg via INTRAVENOUS

## 2012-02-29 MED ORDER — DEXAMETHASONE SODIUM PHOSPHATE 4 MG/ML IJ SOLN
INTRAMUSCULAR | Status: DC | PRN
  Administered 2012-02-29: 4 mg via INTRAVENOUS

## 2012-02-29 MED ORDER — HYDROMORPHONE HCL PF 1 MG/ML IJ SOLN: 0.2500 mg | INTRAMUSCULAR | Status: DC | PRN

## 2012-02-29 MED ORDER — BUPIVACAINE HCL (PF) 0.25 % IJ SOLN
INTRAMUSCULAR | Status: DC | PRN
  Administered 2012-02-29: 20 mL

## 2012-02-29 SURGICAL SUPPLY — 54 items
ADH SKN CLS APL DERMABOND .7 (GAUZE/BANDAGES/DRESSINGS)
APL SKNCLS STERI-STRIP NONHPOA (GAUZE/BANDAGES/DRESSINGS) ×1
APPLIER CLIP 9.375 MED OPEN (MISCELLANEOUS)
APR CLP MED 9.3 20 MLT OPN (MISCELLANEOUS)
BENZOIN TINCTURE PRP APPL 2/3 (GAUZE/BANDAGES/DRESSINGS) ×2 IMPLANT
BINDER BREAST LRG (GAUZE/BANDAGES/DRESSINGS) IMPLANT
BINDER BREAST MEDIUM (GAUZE/BANDAGES/DRESSINGS) IMPLANT
BINDER BREAST XLRG (GAUZE/BANDAGES/DRESSINGS) IMPLANT
BINDER BREAST XXLRG (GAUZE/BANDAGES/DRESSINGS) ×1 IMPLANT
BLADE SURG 15 STRL LF DISP TIS (BLADE) ×1 IMPLANT
BLADE SURG 15 STRL SS (BLADE) ×2
CANISTER SUCTION 1200CC (MISCELLANEOUS) IMPLANT
CHLORAPREP W/TINT 26ML (MISCELLANEOUS) ×2 IMPLANT
CLIP APPLIE 9.375 MED OPEN (MISCELLANEOUS) IMPLANT
CLOTH BEACON ORANGE TIMEOUT ST (SAFETY) ×2 IMPLANT
COVER MAYO STAND STRL (DRAPES) ×2 IMPLANT
COVER TABLE BACK 60X90 (DRAPES) ×2 IMPLANT
DECANTER SPIKE VIAL GLASS SM (MISCELLANEOUS) IMPLANT
DERMABOND ADVANCED (GAUZE/BANDAGES/DRESSINGS)
DERMABOND ADVANCED .7 DNX12 (GAUZE/BANDAGES/DRESSINGS) IMPLANT
DEVICE DUBIN W/COMP PLATE 8390 (MISCELLANEOUS) ×1 IMPLANT
DRAPE PED LAPAROTOMY (DRAPES) ×2 IMPLANT
DRSG TEGADERM 4X4.75 (GAUZE/BANDAGES/DRESSINGS) ×2 IMPLANT
ELECT COATED BLADE 2.86 ST (ELECTRODE) ×2 IMPLANT
ELECT REM PT RETURN 9FT ADLT (ELECTROSURGICAL) ×2
ELECTRODE REM PT RTRN 9FT ADLT (ELECTROSURGICAL) ×1 IMPLANT
GAUZE SPONGE 4X4 12PLY STRL LF (GAUZE/BANDAGES/DRESSINGS) ×2 IMPLANT
GLOVE BIO SURGEON STRL SZ7 (GLOVE) ×2 IMPLANT
GLOVE BIOGEL PI IND STRL 7.5 (GLOVE) ×1 IMPLANT
GLOVE BIOGEL PI INDICATOR 7.5 (GLOVE) ×1
GLOVE ECLIPSE 6.5 STRL STRAW (GLOVE) ×1 IMPLANT
GOWN PREVENTION PLUS XLARGE (GOWN DISPOSABLE) ×2 IMPLANT
KIT MARKER MARGIN INK (KITS) ×1 IMPLANT
NDL HYPO 25X1 1.5 SAFETY (NEEDLE) ×1 IMPLANT
NEEDLE HYPO 25X1 1.5 SAFETY (NEEDLE) ×2 IMPLANT
NS IRRIG 1000ML POUR BTL (IV SOLUTION) IMPLANT
PACK BASIN DAY SURGERY FS (CUSTOM PROCEDURE TRAY) ×2 IMPLANT
PENCIL BUTTON HOLSTER BLD 10FT (ELECTRODE) ×2 IMPLANT
SLEEVE SCD COMPRESS KNEE MED (MISCELLANEOUS) ×2 IMPLANT
SPONGE LAP 4X18 X RAY DECT (DISPOSABLE) ×2 IMPLANT
STRIP CLOSURE SKIN 1/2X4 (GAUZE/BANDAGES/DRESSINGS) ×2 IMPLANT
SUT MNCRL AB 4-0 PS2 18 (SUTURE) ×2 IMPLANT
SUT SILK 2 0 SH (SUTURE) ×2 IMPLANT
SUT VIC AB 2-0 SH 27 (SUTURE) ×2
SUT VIC AB 2-0 SH 27XBRD (SUTURE) ×1 IMPLANT
SUT VIC AB 3-0 SH 27 (SUTURE) ×2
SUT VIC AB 3-0 SH 27X BRD (SUTURE) ×1 IMPLANT
SUT VICRYL AB 3 0 TIES (SUTURE) IMPLANT
SYR CONTROL 10ML LL (SYRINGE) ×2 IMPLANT
TOWEL OR 17X24 6PK STRL BLUE (TOWEL DISPOSABLE) ×2 IMPLANT
TOWEL OR NON WOVEN STRL DISP B (DISPOSABLE) ×2 IMPLANT
TUBE CONNECTING 20X1/4 (TUBING) IMPLANT
WATER STERILE IRR 1000ML POUR (IV SOLUTION) ×2 IMPLANT
YANKAUER SUCT BULB TIP NO VENT (SUCTIONS) ×1 IMPLANT

## 2012-02-29 NOTE — Anesthesia Procedure Notes (Signed)
Procedure Name: LMA Insertion Date/Time: 02/29/2012 2:59 PM Performed by: Verlan Friends Pre-anesthesia Checklist: Patient identified, Emergency Drugs available, Suction available, Patient being monitored and Timeout performed Patient Re-evaluated:Patient Re-evaluated prior to inductionOxygen Delivery Method: Circle System Utilized Preoxygenation: Pre-oxygenation with 100% oxygen Intubation Type: IV induction Ventilation: Mask ventilation without difficulty LMA: LMA with gastric port inserted LMA Size: 4.0 Number of attempts: 1 Placement Confirmation: positive ETCO2 and breath sounds checked- equal and bilateral Tube secured with: Tape Dental Injury: Teeth and Oropharynx as per pre-operative assessment

## 2012-02-29 NOTE — Anesthesia Postprocedure Evaluation (Signed)
  Anesthesia Post-op Note  Patient: Lorraine Murphy  Procedure(s) Performed: Procedure(s) (LRB): BREAST LUMPECTOMY WITH NEEDLE LOCALIZATION (Right)  Patient Location: PACU  Anesthesia Type: General  Level of Consciousness: awake  Airway and Oxygen Therapy: Patient Spontanous Breathing  Post-op Pain: mild  Post-op Assessment: Post-op Vital signs reviewed, Patient's Cardiovascular Status Stable, Respiratory Function Stable, Patent Airway, No signs of Nausea or vomiting, Adequate PO intake and Pain level controlled  Post-op Vital Signs: stable  Complications: No apparent anesthesia complications

## 2012-02-29 NOTE — Interval H&P Note (Signed)
History and Physical Interval Note:  02/29/2012 2:32 PM  Lorraine Murphy  has presented today for surgery, with the diagnosis of Right breast cancer  The various methods of treatment have been discussed with the patient and family. After consideration of risks, benefits and other options for treatment, the patient has consented to  Procedure(s) (LRB): BREAST LUMPECTOMY WITH NEEDLE LOCALIZATION (Right) as a surgical intervention .  The patients' history has been reviewed, patient examined, no change in status, stable for surgery.  I have reviewed the patients' chart and labs.  Questions were answered to the patient's satisfaction.     [name]

## 2012-02-29 NOTE — Anesthesia Preprocedure Evaluation (Addendum)
Anesthesia Evaluation  Patient identified by MRN, date of birth, ID band Patient awake    Reviewed: Allergy & Precautions, H&P , NPO status , Patient's Chart, lab work & pertinent test results  History of Anesthesia Complications (+) AWARENESS UNDER ANESTHESIA  Airway Mallampati: II TM Distance: >3 FB Neck ROM: Full    Dental   Pulmonary    Pulmonary exam normal       Cardiovascular hypertension,     Neuro/Psych Anxiety Schizophrenia    GI/Hepatic GERD-  ,  Endo/Other  Diabetes mellitus-Morbid obesity  Renal/GU      Musculoskeletal   Abdominal (+) + obese,   Peds  Hematology   Anesthesia Other Findings   Reproductive/Obstetrics                          Anesthesia Physical Anesthesia Plan  ASA: III  Anesthesia Plan: General   Post-op Pain Management:    Induction: Intravenous  Airway Management Planned: LMA  Additional Equipment:   Intra-op Plan:   Post-operative Plan: Extubation in OR  Informed Consent: I have reviewed the patients History and Physical, chart, labs and discussed the procedure including the risks, benefits and alternatives for the proposed anesthesia with the patient or authorized representative who has indicated his/her understanding and acceptance.     Plan Discussed with: Surgeon and CRNA  Anesthesia Plan Comments:        Anesthesia Quick Evaluation

## 2012-02-29 NOTE — H&P (View-Only) (Signed)
Patient ID: Lorraine Murphy, female   DOB: 09/23/1935, 77 y.o.   MRN: [id number]  Chief Complaint  Patient presents with  . Other    breast cancer    HPI Lorraine Murphy is a 77 y.o. female.  Referred by Dr. Rick Cornella HPI 77 yof who noted a right breast mass while lying down about a month ago.  She notes no other complaints referable to her breasts.  Her mammogram showed in the right breast a small well circumscribed mass.  Ultrasound shows a .6x1 cm mass.  US guided core biopsy was performed.  MRI was performed showing a 1.3x0.6x0.8 cm mass.  There is a 1.2 cm medial to this mas an enhancing small mass.  There is no abnormal adenopathy or anything abnormal in the left breast.  An ultrasound was performed showing a benign lesion near the cancer but nothing that correlates with the MR finding.  Neither of these is concerning to radiologists at conference this morning.   Her biopsy shows invasive ductal carcinoma, prognostic panel pending.  Past Medical History  Diagnosis Date  . Hypertension   . Breast cancer   . Diabetes mellitus   . Wears glasses   . H/O colonoscopy 2012  . H/O bone density study   . Incontinence   . Arthritis   . Skin cancer     Past Surgical History  Procedure Date  . Appendectomy   . Tonsilectomy, adenoidectomy, bilateral myringotomy and tubes   . Tubal ligation   . Spider vein injection     Family History  Problem Relation Age of Onset  . Lung cancer Father     Social History History  Substance Use Topics  . Smoking status: Never Smoker   . Smokeless tobacco: Not on file  . Alcohol Use: Yes    No Known Allergies  Current Outpatient Prescriptions  Medication Sig Dispense Refill  . amLODipine (NORVASC) 5 MG tablet Take 5 mg by mouth daily.      . aspirin 81 MG tablet Take 81 mg by mouth daily.      . atorvastatin (LIPITOR) 20 MG tablet       . fish oil-omega-3 fatty acids 1000 MG capsule Take 2 g by mouth daily.      . haloperidol decanoate  (HALDOL DECANOATE) 50 MG/ML injection       . losartan-hydrochlorothiazide (HYZAAR) 50-12.5 MG per tablet       . Multiple Vitamin (MULTIVITAMIN) tablet Take 1 tablet by mouth daily.        Review of Systems Review of Systems  Constitutional: Negative for fever, chills and unexpected weight change.  HENT: Negative for hearing loss, congestion, sore throat, trouble swallowing and voice change.   Eyes: Negative for visual disturbance.  Respiratory: Negative for cough and wheezing.   Cardiovascular: Negative for chest pain, palpitations and leg swelling.  Gastrointestinal: Negative for nausea, vomiting, abdominal pain, diarrhea, constipation, blood in stool, abdominal distention and anal bleeding.  Genitourinary: Negative for hematuria, vaginal bleeding and difficulty urinating.  Musculoskeletal: Positive for arthralgias.  Skin: Negative for rash and wound.  Neurological: Negative for seizures, syncope and headaches.  Hematological: Negative for adenopathy. Does not bruise/bleed easily.  Psychiatric/Behavioral: Negative for confusion.    There were no vitals taken for this visit.  Physical Exam Physical Exam  Vitals reviewed. Constitutional: She appears well-developed and well-nourished.  Neck: Neck supple.  Cardiovascular: Normal rate, regular rhythm and normal heart sounds.   Pulmonary/Chest: Effort normal and breath   sounds normal. She has no wheezes. She has no rales. Right breast exhibits mass. Right breast exhibits no inverted nipple, no nipple discharge, no skin change and no tenderness. Left breast exhibits no inverted nipple, no mass, no nipple discharge, no skin change and no tenderness. Breasts are symmetrical.    Lymphadenopathy:    She has no cervical adenopathy.    Data Reviewed BILATERAL BREAST MRI WITH AND WITHOUT CONTRAST  Technique: Multiplanar, multisequence MR images of both breasts  were obtained prior to and following the intravenous administration  of 20ml  of multihance. Three dimensional images were evaluated at  the independent DynaCad workstation.  Comparison: Mammograms from [facility] dated 02/09/2012,  02/07/2012 and 02/03/2011  Findings: There is a moderate nodular background parenchymal  enhancement pattern. In the superficial 9 o'clock region of the  right breast there is an enhancing irregular mass measuring 1.3 x  0.6 x 0.8 cm associated with a clip artifact that is consistent  with the known malignancy. 1.2 cm medial to this mass there is an  enhancing ovoid 0.6 x 0.2 x 0.4 cm mass.  No enlarged axillary or internal mammary adenopathy is detected.  There is no abnormal enhancement the left breast.  IMPRESSION:  1. 1.3 cm irregular enhancing mass in the 9 o'clock region of the  right breast corresponding well with the known malignancy.  Treatment planning is recommended.  2. Medial to known malignancy is a second enhancing mass for which  second look ultrasound is recommended. If the lesion is not seen  sonographically MRI guided core biopsy would be suggested.   Assessment    Left breast cancer    Plan   Left breast lumpectomy  We discussed the staging and pathophysiology of breast cancer. We discussed all of the different options for treatment for breast cancer including surgery, chemotherapy, radiation therapy, Herceptin, and antiestrogen therapy.  We will likely not do a sentinel node biopsy as both medical and radiation oncology do not think we will need for treatment. Will await prognostic panel and then discuss with them. We discussed the options for treatment of the breast cancer which included lumpectomy versus a mastectomy. We discussed the performance of the lumpectomy with a wire placement. We discussed a 10-20% chance of a positive margin requiring reexcision in the operating room. We also discussed that she may need radiation therapy or antiestrogen therapy or both if she undergoes lumpectomy. We  discussed the mastectomy and the postoperative care for that as well. We discussed that there is no difference in her survival whether she undergoes lumpectomy with radiation therapy or antiestrogen therapy versus a mastectomy. There is a slight difference in the local recurrence rate being 3-5% with lumpectomy and about 1% with a mastectomy. We discussed the risks of operation including bleeding, infection, possible reoperation. She understands her further therapy will be based on what her stages at the time of her operation.        Bradley Handyside 02/22/2012, 5:17 PM    

## 2012-02-29 NOTE — Transfer of Care (Signed)
Immediate Anesthesia Transfer of Care Note  Patient: Lorraine Murphy  Procedure(s) Performed: Procedure(s) (LRB): BREAST LUMPECTOMY WITH NEEDLE LOCALIZATION (Right)  Patient Location: PACU  Anesthesia Type: General  Level of Consciousness: awake, alert  and oriented  Airway & Oxygen Therapy: Patient Spontanous Breathing and Patient connected to face mask oxygen  Post-op Assessment: Report given to PACU RN and Post -op Vital signs reviewed and stable  Post vital signs: Reviewed and stable  Complications: No apparent anesthesia complications

## 2012-02-29 NOTE — Op Note (Signed)
Preoperative diagnosis: right breast cancer, clinical stage I Postoperative diagnosis: Same as above Procedure:Right breast wire-guided lumpectomy Surgeon: Dr. Harden Mo Anesthesia: Gen. With LMA Specimens: #1 right breast lumpectomy marked with the paint kit #2 additional lateral margin marked with a short stitch superior, long stitch lateral, double stitch deep Consultations: None Drains: None Estimated blood loss: Minimal Sponge and needle count was correct x2 at end of operation Disposition to recovery in stable condition  Indications: This is a 77 year old female with a palpable right breast mass who underwent evaluation and this was an invasive ductal cancer. We discussed all of her options at the multidisciplinary conference and decided to proceed with breast conservation therapy. It was thought that she did not need a sentinel node for decisions for therapy so we are going to hold it back. We discussed the risks and benefits of the procedure prior to beginning.  Procedure: After informed consent was obtained the patient was taken to the operating room. She first had a wire placed at the breast center. I had these mammograms available for my review prior to beginning in the operating room. She had sequential compression devices placed on her legs prior to beginning. She was then placed under general anesthesia. Her right breast was prepped and draped in the standard sterile surgical fashion. Surgical timeout was performed.  I then made a radial incision after excising an ellipse of skin overlying the mass. Cautery was then used to remove the wire and the surrounding tissue. The mass was palpable and  clinically I was clearly around the mass. Faxitron mammogram was then taken confirming removal of the clip, mass and the wire. I did take an additional lateral margin due to the fact that this appeared close on the mammogram. Hemostasis was observed. I placed 2 clips deep in the cavity and  one clip in each cardinal position around the cavity. I then closes with 2-0 Vicryl, 3-0 Vicryl, and 4 Monocryl. I infiltrated a total of 20 cc of quarter percent Marcaine. Steri-Strips and a sterile dressing were placed. A breast binder was placed. She was extubated in the operating room and transferred to recovery stable.

## 2012-02-29 NOTE — Discharge Instructions (Signed)
[facility] °Office Phone Number [phone] ° °BREAST BIOPSY/ PARTIAL MASTECTOMY: POST OP INSTRUCTIONS ° °Always review your discharge instruction sheet given to you by the facility where your surgery was performed. ° °IF YOU HAVE DISABILITY OR FAMILY LEAVE FORMS, YOU MUST BRING THEM TO THE OFFICE FOR PROCESSING.  DO NOT GIVE THEM TO YOUR DOCTOR. ° °1. A prescription for pain medication may be given to you upon discharge.  Take your pain medication as prescribed, if needed.  If narcotic pain medicine is not needed, then you may take acetaminophen (Tylenol), naprosyn (Alleve) or ibuprofen (Advil) as needed. °2. Take your usually prescribed medications unless otherwise directed °3. If you need a refill on your pain medication, please contact your pharmacy.  They will contact our office to request authorization.  Prescriptions will not be filled after 5pm or on week-ends. °4. You should eat very light the first 24 hours after surgery, such as soup, crackers, pudding, etc.  Resume your normal diet the day after surgery. °5. Most patients will experience some swelling and bruising in the breast.  Ice packs and a good support bra will help.  Wear the breast binder provided or a sports bra for 72 hours day and night.  After that wear a sports bra during the day until you return to the office. Swelling and bruising can take several days to resolve.  °6. It is common to experience some constipation if taking pain medication after surgery.  Increasing fluid intake and taking a stool softener will usually help or prevent this problem from occurring.  A mild laxative (Milk of Magnesia or Miralax) should be taken according to package directions if there are no bowel movements after 48 hours. °7. Unless discharge instructions indicate otherwise, you may remove your bandages 48 hours after surgery and you may shower at that time.  You may have steri-strips (small skin tapes) in place directly over the incision.   These strips should be left on the skin for 7-10 days and will come off on their own.  If your surgeon used skin glue on the incision, you may shower in 24 hours.  The glue will flake off over the next 2-3 weeks.  Any sutures or staples will be removed at the office during your follow-up visit. °8. ACTIVITIES:  You may resume regular daily activities (gradually increasing) beginning the next day.  Wearing a good support bra or sports bra minimizes pain and swelling.  You may have sexual intercourse when it is comfortable. °a. You may drive when you no longer are taking prescription pain medication, you can comfortably wear a seatbelt, and you can safely maneuver your car and apply brakes. °b. RETURN TO WORK:  ______________________________________________________________________________________ °9. You should see your doctor in the office for a follow-up appointment approximately two weeks after your surgery.  Your doctor’s nurse will typically make your follow-up appointment when she calls you with your pathology report.  Expect your pathology report 3-4 business days after your surgery.  You may call to check if you do not hear from us after three days. °10. OTHER INSTRUCTIONS: _______________________________________________________________________________________________ _____________________________________________________________________________________________________________________________________ °_____________________________________________________________________________________________________________________________________ °_____________________________________________________________________________________________________________________________________ ° °WHEN TO CALL DR WAKEFIELD: °1. Fever over 101.0 °2. Nausea and/or vomiting. °3. Extreme swelling or bruising. °4. Continued bleeding from incision. °5. Increased pain, redness, or drainage from the incision. ° °The clinic staff is available to  answer your questions during regular business hours.  Please don’t hesitate to call and ask to speak to one of the nurses for   clinical concerns.  If you have a medical emergency, go to the nearest emergency room or call 911.  A surgeon from [facility] is always on call at the hospital. ° °For further questions, please visit [email or web address] ° °Post Anesthesia Home Care Instructions ° °Activity: °Get plenty of rest for the remainder of the day. A responsible adult should stay with you for 24 hours following the procedure.  °For the next 24 hours, DO NOT: °-Drive a car °-Operate machinery °-Drink alcoholic beverages °-Take any medication unless instructed by your physician °-Make any legal decisions or sign important papers. ° °Meals: °Start with liquid foods such as gelatin or soup. Progress to regular foods as tolerated. Avoid greasy, spicy, heavy foods. If nausea and/or vomiting occur, drink only clear liquids until the nausea and/or vomiting subsides. Call your physician if vomiting continues. ° °Special Instructions/Symptoms: °Your throat may feel dry or sore from the anesthesia or the breathing tube placed in your throat during surgery. If this causes discomfort, gargle with warm salt water. The discomfort should disappear within 24 hours. ° °

## 2012-03-05 ENCOUNTER — Telehealth (INDEPENDENT_AMBULATORY_CARE_PROVIDER_SITE_OTHER): Payer: Self-pay | Admitting: General Surgery

## 2012-03-05 NOTE — Telephone Encounter (Signed)
  OK for ABC class Dr. Harden Mo

## 2012-03-08 ENCOUNTER — Encounter: Payer: Self-pay | Admitting: Oncology

## 2012-03-13 ENCOUNTER — Encounter (INDEPENDENT_AMBULATORY_CARE_PROVIDER_SITE_OTHER): Payer: Self-pay

## 2012-03-19 ENCOUNTER — Encounter: Payer: Self-pay | Admitting: Radiation Oncology

## 2012-03-19 ENCOUNTER — Ambulatory Visit
Admission: RE | Admit: 2012-03-19 | Discharge: 2012-03-19 | Disposition: A | Discharge status: Home / Self Care | Payer: Medicare Other | Source: Ambulatory Visit | Attending: Radiation Oncology | Admitting: Radiation Oncology

## 2012-03-19 ENCOUNTER — Encounter: Payer: Self-pay | Admitting: *Deleted

## 2012-03-19 VITALS — BP 120/65 | HR 66 | Temp 97.6°F | Resp 20 | Wt 249.6 lb

## 2012-03-19 DIAGNOSIS — C50419 Malignant neoplasm of upper-outer quadrant of unspecified female breast: Secondary | ICD-10-CM

## 2012-03-19 DIAGNOSIS — C50919 Malignant neoplasm of unspecified site of unspecified female breast: Secondary | ICD-10-CM | POA: Insufficient documentation

## 2012-03-19 DIAGNOSIS — C4490 Unspecified malignant neoplasm of skin, unspecified: Secondary | ICD-10-CM | POA: Insufficient documentation

## 2012-03-19 DIAGNOSIS — F419 Anxiety disorder, unspecified: Secondary | ICD-10-CM | POA: Insufficient documentation

## 2012-03-19 DIAGNOSIS — Z79899 Other long term (current) drug therapy: Secondary | ICD-10-CM | POA: Insufficient documentation

## 2012-03-19 HISTORY — DX: Depression, unspecified: F32.A

## 2012-03-19 HISTORY — DX: Major depressive disorder, single episode, unspecified: F32.9

## 2012-03-19 NOTE — Progress Notes (Signed)
New consult right breast cancer DX-02/09/12 Right breast lumpectomy 02/29/12=invqsive ductal carcinoma,ER?PR=positive,her2 neu=neg. Breast incision well healed  First ABC class attended today,ptient alert,oriented x3, steady gait, has lower back pain  Divorced, 2 sons, father deceased lung cancer, 1 maternal auunt brain cancer and 1 paternal aunt as well with brain cancer  Allergies:NKDA

## 2012-03-19 NOTE — Progress Notes (Signed)
Please see the Nurse Progress Note in the MD Initial Consult Encounter for this patient. 

## 2012-03-19 NOTE — Progress Notes (Signed)
Radiation Oncology         [phone] ________________________________  Name: TAJI SATHER MRN: [id number]  Date: 03/19/2012  DOB: June 17, 1935  Reevaluation note  CC: No primary provider on file.  Hoxworth, Lorne Skeens, MD  Diagnosis:   Stage I invasive ductal carcinoma the right breast    Narrative:  The patient returns today for further evaluation.  Patient was initially seen in the multidisciplinary breast clinic. Since that time the patient proceeded with definitive surgery on the direction of Dr. Dwain Sarna. On 02/29/2012 the patient underwent right breast wire guided lumpectomy. Prior to the surgery the patient did undergo a ultrasound of the right breast in light of  the second lesion noted on MRI. This  showed a rounded lesion measuring 2 x 5 mm approximately 3 cm medial to the biopsy lesion. This had the benign appearance on ultrasound with some calcifications. This lesion however did not completely coincide with the additional finding on MRI which was located approximately 1 cm from the biopsied mass.                              ALLERGIES:   has no known allergies.  Meds: Current Outpatient Prescriptions  Medication Sig Dispense Refill  . amLODipine (NORVASC) 5 MG tablet Take 5 mg by mouth daily.      Marland Kitchen aspirin 81 MG tablet Take 81 mg by mouth daily.      Marland Kitchen atorvastatin (LIPITOR) 20 MG tablet Take 40 mg by mouth daily. Just increased to taking 20mg  2 tabs daily      . Calcium Carbonate-Vitamin D (CALCIUM-VITAMIN D) 500-200 MG-UNIT per tablet Take 1 tablet by mouth daily.       . fish oil-omega-3 fatty acids 1000 MG capsule Take 2 g by mouth daily.      . haloperidol decanoate (HALDOL DECANOATE) 50 MG/ML injection every 28 (twenty-eight) days. monthly      . losartan-hydrochlorothiazide (HYZAAR) 50-12.5 MG per tablet       . Multiple Vitamin (MULTIVITAMIN) tablet Take 1 tablet by mouth daily.      . naproxen sodium (ANAPROX) 220 MG tablet Take 220 mg by mouth 2 (two) times  daily with a meal.      . oxyCODONE-acetaminophen (PERCOCET) 5-325 MG per tablet         Physical Findings: The patient is in no acute distress. Patient is alert and oriented.  weight is 249 lb 9.6 oz (113.218 kg). Her oral temperature is 97.6 F (36.4 C). Her blood pressure is 120/65 and her pulse is 66. Her respiration is 20. Marland Kitchen  There is no palpable supraclavicular or axillary adenopathy. The lungs are clear to auscultation. The heart has regular rhythm and rate. Examination of the right breast reveals a well healing scar in approximately 9:30 position of the right breast. There's no signs of infection in the breast or dominant mass.  Lab Findings: Lab Results  Component Value Date   WBC 8.8 02/22/2012   HGB 12.6 02/22/2012   HCT 39.0 02/22/2012   MCV 87.4 02/22/2012   PLT 335 02/22/2012    @LASTCHEM @  Radiographic Findings: No results found.  Impression:  Stage TI- b low-grade invasive ductal carcinoma right breast. The tumor was completely removed with a 2 mm  margin. Tumor was estrogen receptor positive at 100% and progesterone receptor positive at 6%.  Presumably the questionable lesion 1 cm from the primary lesion was removed at  the time of the patient's surgery however the patient will meet with Dr. Dwain Sarna tomorrow to discuss this issue.  I discussed 2 general options concerning postoperative management. We discussed radiation therapy. Also discussed adjuvant hormonal treatment. Patient wishes to procede with adjuvant hormonal therapy as part of her overall management. She will be meeting with Dr. Darnelle Catalan later this month to discuss this issue. I've asked patient to call me if she should change her mind and wish to proceed with radiation therapy as part of her overall management.    _____________________________________   Billie Lade, PhD, MD

## 2012-03-20 ENCOUNTER — Encounter (INDEPENDENT_AMBULATORY_CARE_PROVIDER_SITE_OTHER): Payer: Self-pay | Admitting: General Surgery

## 2012-03-20 ENCOUNTER — Ambulatory Visit (INDEPENDENT_AMBULATORY_CARE_PROVIDER_SITE_OTHER): Payer: Medicare Other | Admitting: General Surgery

## 2012-03-20 ENCOUNTER — Encounter (INDEPENDENT_AMBULATORY_CARE_PROVIDER_SITE_OTHER): Payer: Medicare Other | Admitting: General Surgery

## 2012-03-20 VITALS — BP 144/72 | HR 78 | Temp 97.4°F | Ht 64.5 in | Wt 251.0 lb

## 2012-03-20 DIAGNOSIS — Z09 Encounter for follow-up examination after completed treatment for conditions other than malignant neoplasm: Secondary | ICD-10-CM

## 2012-03-20 NOTE — Progress Notes (Signed)
Subjective:     Patient ID: Lorraine Murphy, female   DOB: 1935/07/30, 77 y.o.   MRN: [id number]  HPI This is a 77 year old female I saw in the breast multidisciplinary clinic. She had a right breast cancer we decided to perform breast conservation therapy without a sentinel lymph node biopsy. She underwent a lumpectomy on May 29. Her pathology shows an invasive grade 1 ductal carcinoma spanning 1 cm without lymphovascular invasion. This is ER positive at 100%, PR-positive at 6%, HER-2/neu is not amplified and her proliferation index is low at 7%. Her closest margin is 2 mm on the original lumpectomy. I took some more tissue and this is well clear. She also had an abnormality that was seen on MRI it was not seen on the second look ultrasound at the radiologist were not very concerned about. I took an extra margin medially to ensure that this would be removed I feel comfortable that this should have been removed at the same time. She is doing well today without any significant complaints. She was seen by radiation oncology yesterday to discuss adjuvant therapy.  Review of Systems     Objective:   Physical Exam Right breast lumpectomy incision healing well without infection    Assessment:     Stage I right breast cancer    Plan:     I released her to full activity today. I plan on seeing her back in 2 months just to make sure everything is healing. She is going to begin some sort of adjuvant therapy. She has seen radiation therapy and will see medical oncology later this month to discuss anti-estrogen therapy. My conversation with her today she was leaning towards trying radiation therapy. If she decides to do xrt I will see her a month or so after that.

## 2012-03-29 ENCOUNTER — Ambulatory Visit (HOSPITAL_BASED_OUTPATIENT_CLINIC_OR_DEPARTMENT_OTHER): Payer: Medicare Other | Admitting: Oncology

## 2012-03-29 ENCOUNTER — Telehealth: Payer: Self-pay | Admitting: Oncology

## 2012-03-29 VITALS — BP 138/79 | HR 90 | Temp 97.9°F | Ht 64.5 in | Wt 242.9 lb

## 2012-03-29 DIAGNOSIS — C50419 Malignant neoplasm of upper-outer quadrant of unspecified female breast: Secondary | ICD-10-CM

## 2012-03-29 DIAGNOSIS — Z170 Estrogen receptor positive status [ER+]: Secondary | ICD-10-CM

## 2012-03-29 DIAGNOSIS — C50919 Malignant neoplasm of unspecified site of unspecified female breast: Secondary | ICD-10-CM

## 2012-03-29 MED ORDER — ANASTROZOLE 1 MG PO TABS: 1.0000 mg | ORAL_TABLET | Freq: Every day | ORAL | Status: AC

## 2012-03-29 NOTE — Telephone Encounter (Signed)
lmonvm adviisng the pt of her oct appts °

## 2012-03-29 NOTE — Progress Notes (Signed)
ID: Lorraine Murphy   DOB: [birth year]  MR#: [id number]  LKG#:[id number]  HISTORY OF PRESENT ILLNESS: Lorraine Murphy (pronoiunced "Goin") palpated a mass in her right breast about a week before routine mammography was due 02/07/2012. She mentioned this at [facility] so her screening mammography was changed to diagnostic, and this showed a small well circumscribed mass in the right breast, without associated microcalcifications. This was palpable and mobile by exam. Ultrasound showed a hypoechoic lobulated mass measuring 1.0 cm. Core biopsy was performed May 9 and showed ([id number]) an invasive ductal carcinoma, grade 1, with a prognostic panel pending. Bilateral breast MRI was performed 02/17/2012 and confirmed an enhancing irregular mass measuring 1.3 cm in the right breast, with a 0.6 cm mass medial to that. There was no enlarged axillary or internal mammary adenopathy. The patient's subsequent history is as detailed below.  INTERVAL HISTORY: The patient returns today for followup of her breast cancer. Since her last visit here, she had her definitive surgery. She has also met with Dr. Alva Garnet in radiation. The teens feeling is that if the patient agrees to antiestrogen therapy, radiation may be avoided.  REVIEW OF SYSTEMS: She did well with her surgery, without significant problems with pain, fever, bleeding, swelling, or dehiscence. She feels tired, and sleeps poorly. She frequently has pain in her left midback, which is not new, and which is not more intense or frequent than prior. She has mild urinary leakage she is depressed and would like to receive support services. Today in particular she was very concerned because on the one hand she is alone, and on the other hand family is visiting this weekend and that is wonderful but also stressful. A detailed review of systems was otherwise noncontributory  PAST MEDICAL HISTORY: Past Medical History  Diagnosis Date  . Hypertension   . Wears glasses   . H/O  colonoscopy 2012  . H/O bone density study   . Incontinence   . Arthritis   . GERD (gastroesophageal reflux disease)   . Obesity   . Osteoarthritis   . Breast cancer 02/29/12    right breast lumpectomy=invasive ductal cs ,ER?PR=positive her2 neu=neg  . Skin cancer   . Bilateral cataracts     no surgery  . Anxiety   . Depression   . 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 Diabetes mellitus     diet contolled    PAST SURGICAL HISTORY: Past Surgical History  Procedure Date  . Appendectomy   . Tonsilectomy, adenoidectomy, bilateral myringotomy and tubes   . Tubal ligation   . Spider vein injection   . Breast surgery     right breast lumpectomy    FAMILY HISTORY Family History  Problem Relation Age of Onset  . Lung cancer Father    the patient's father died at the age of 50, shortly after being diagnosed with lung cancer, in the setting of tobacco abuse. The patient's mother died at the age of 62, possibly from heart disease. The patient had 2 brothers. One of them died in infancy. Of the other has a history of skin cancers, not melanoma. The patient had one sister who died from complications of emphysema. There is no history of breast or ovarian cancer in the family.  GYNECOLOGIC HISTORY: Menarche age 55, first live birth age 63, she is GX P2, went through menopause approximately age 63, and used hormone replacement for 10-15 years, stopping several years ago.   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 She is divorced and lives at home with her beagle "Cossey." Her son [name] is a IT trainer and lives in [place]. Son [name] is a Stage manager in [place]. The patient is a [employer]. She has 1 grandchild  ADVANCED DIRECTIVES: No advanced directives in place. Though she does have mental health advanced directives.  HEALTH MAINTENANCE: History  Substance Use Topics  . Smoking status:  Never Smoker   . Smokeless tobacco: Not on file  . Alcohol Use: Yes     socially      Colonoscopy: 2012  PAP: 2010?  Bone density: 2000, nl  Lipid panel:  No Known Allergies  Current Outpatient Prescriptions  Medication Sig Dispense Refill  . amLODipine (NORVASC) 5 MG tablet Take 5 mg by mouth daily.      Marland Kitchen aspirin 81 MG tablet Take 81 mg by mouth daily.      Marland Kitchen atorvastatin (LIPITOR) 80 MG tablet Take 40 mg by mouth daily.      . Calcium Carbonate-Vitamin D (CALCIUM-VITAMIN D) 500-200 MG-UNIT per tablet Take 1 tablet by mouth daily.       . fish oil-omega-3 fatty acids 1000 MG capsule Take 2 g by mouth daily.      . haloperidol decanoate (HALDOL DECANOATE) 50 MG/ML injection every 28 (twenty-eight) days. monthly      . losartan-hydrochlorothiazide (HYZAAR) 50-12.5 MG per tablet       . Multiple Vitamin (MULTIVITAMIN) tablet Take 1 tablet by mouth daily.      . naproxen sodium (ANAPROX) 220 MG tablet Take 220 mg by mouth 2 (two) times daily with a meal.      . anastrozole (ARIMIDEX) 1 MG tablet Take 1 tablet (1 mg total) by mouth daily.  90 tablet  12    OBJECTIVE: Middle-aged white woman who was tearful today Filed Vitals:   03/29/12 1406  BP: 138/79  Pulse: 90  Temp: 97.9 F (36.6 C)     Body mass index is 41.05 kg/(m^2).    ECOG FS: 1  Sclerae unicteric Oropharynx clear No cervical or supraclavicular adenopathy Lungs no rales or rhonchi Heart regular rate and rhythm Abd benign MSK no focal spinal tenderness Neuro: nonfocal; normal affect, alert and oriented x3 Breasts: The right breast is status post lumpectomy. The incision has healed very nicely. There is some distortion of the skin at the incision as expected, but no other skin or nipple findings of concern, and no palpable masses in the breast or axilla. The left breast is unremarkable.  LAB RESULTS: Lab Results  Component Value Date   WBC 8.8 02/22/2012   NEUTROABS 4.5 02/22/2012   HGB 12.6 02/22/2012   HCT  39.0 02/22/2012   MCV 87.4 02/22/2012   PLT 335 02/22/2012      Chemistry      Component Value Date/Time   NA 143 02/28/2012 1200   K 4.1 02/28/2012 1200   CL 106 02/28/2012 1200   CO2 25 02/28/2012 1200   BUN 13 02/28/2012 1200   CREATININE 0.81 02/28/2012 1200      Component Value Date/Time   CALCIUM 10.1 02/28/2012 1200   ALKPHOS 89 02/22/2012 1255   AST 20 02/22/2012 1255   ALT 19 02/22/2012 1255   BILITOT 0.2* 02/22/2012 1255       Lab Results  Component Value Date   LABCA2 38 02/22/2012    No components found with this basename: [id number]  No results found for this basename: INR:1;PROTIME:1 in the last 168 hours  Urinalysis No results found for this basename: colorurine,  appearanceur,  labspec,  phurine,  glucoseu,  hgbur,  bilirubinur,  ketonesur,  proteinur,  urobilinogen,  nitrite,  leukocytesur    STUDIES: Patient Name: Lorraine Murphy, Lorraine Murphy Accession #: [id number] DOB: [birth year] Age: 18 Gender: F Client Name [facility] Collected Date: 02/29/2012 Received Date: 02/29/2012 Physician:Matthew Wakefield Chart #: MRN # : [id number] Physician cc: Lamont Dowdy, MD Race:W Visit #: [id number] Kaylyn Lim, RN Rogelia Mire, MD REPORT OF SURGICAL PATHOLOGY ADDITIONAL INFORMATION: 1. CHROMOGENIC IN-SITU HYBRIDIZATION Interpretation HER-2/NEU BY CISH - NO AMPLIFICATION OF HER-2 DETECTED. THE RATIO OF HER-2: CEP 17 SIGNALS WAS 1.26. Reference range: Ratio: HER2:CEP17 < 1.8 - gene amplification not observed Ratio: HER2:CEP 17 1.8-2.2 - equivocal result Ratio: HER2:CEP17 > 2.2 - gene amplification observed Pecola Leisure MD Pathologist, Electronic Signature ( Signed 03/07/2012) FINAL DIAGNOSIS Diagnosis 1. Breast, lumpectomy, Right - INVASIVE GRADE 1 DUCTAL CARCINOMA, SPANNING 1 CM. - LYMPHOVASCULAR INVASION IS NOT IDENTIFIED. - SEE ONCOLOGY TEMPLATE. 2. Breast, excision, Right superior - FIBROCYSTIC CHANGES. - BENIGN DUCTS WITH  CALCIFICATION. - NO ATYPIA, HYPERPLASIA OR MALIGNANCY IDENTIFIED.  ASSESSMENT: 77 y.o.  Lorraine Murphy woman status post right lumpectomy 02/29/2012 for a pT1b cN0, stage IA invasive ductal carcinoma, grade 1, estrogen receptor 100% positive, progesterone receptor 6% positive, with an MIB-1 of 7%, and no HER-2 amplification.   PLAN: We had a long discussion today regarding her breast cancer and she understands she has a very good prognosis. We would feel very comfortable about her foregoing radiation if she took anti-estrogens. She is interested in doing that because she understands the benefits of risk reduction. Today we also talked about possible toxicities, side effects, and complications of the various agents  She would be a good candidate for either tamoxifen or anastrozole, but we are going to go with anastrozole because of the fewest concerns regarding drug interactions and because she has an excellent bone density. She is going to see [place] again in October to make sure she is tolerating treatment well. If she is, we will start seeing her on an every 6 month basis. She knows to call for any problems that may develop before the next visit.   Kelon Easom C    03/29/2012

## 2012-04-18 ENCOUNTER — Encounter: Payer: Self-pay | Admitting: Dietician

## 2012-04-18 NOTE — Progress Notes (Signed)
Brief Out-patient Oncology Nutrition Note  Reason: Patient attended breast cancer nutrition class.   I have educated the patient on plant-based diet. I also discussed and provided nutrition handouts and research based evidence on breast cancer nutrition. We discussed nutrition management for symptoms associated with treatment. The class lasted a duration of 1 hour. The patient's asked good questions and were without any further nutrition related questions or concerns. RD contact information was provided. Patient's were instructed to contact RD for future nutrition questions or concerns.   RD available for nutrition needs.   Vernel Donlan Devina [phone] 

## 2012-04-19 ENCOUNTER — Encounter (INDEPENDENT_AMBULATORY_CARE_PROVIDER_SITE_OTHER): Payer: Self-pay

## 2012-04-24 ENCOUNTER — Encounter: Payer: Self-pay | Admitting: Oncology

## 2012-04-24 NOTE — Progress Notes (Signed)
Patient mailed a note with at check for 100.00 on 04/18/12 I received on 04/24/12. I took check to [employer] at [facility]. Check # [phone] to [facility]

## 2012-05-21 ENCOUNTER — Ambulatory Visit (INDEPENDENT_AMBULATORY_CARE_PROVIDER_SITE_OTHER): Payer: Medicare Other | Admitting: General Surgery

## 2012-05-21 ENCOUNTER — Encounter (INDEPENDENT_AMBULATORY_CARE_PROVIDER_SITE_OTHER): Payer: Self-pay | Admitting: General Surgery

## 2012-05-21 VITALS — BP 130/72 | HR 72 | Temp 97.1°F | Resp 16 | Ht 64.5 in | Wt 242.4 lb

## 2012-05-21 DIAGNOSIS — Z09 Encounter for follow-up examination after completed treatment for conditions other than malignant neoplasm: Secondary | ICD-10-CM

## 2012-05-21 NOTE — Progress Notes (Signed)
Subjective:     Patient ID: Lorraine Murphy, female   DOB: 1935-05-05, 77 y.o.   MRN: [id number]  HPI This is a 77 year old female I saw in the breast multidisciplinary clinic. She had a right breast cancer we decided to perform breast conservation therapy without a sentinel lymph node biopsy. She underwent a lumpectomy on May 29. Her pathology showed an invasive grade 1 ductal carcinoma spanning 1 cm without lymphovascular invasion. This is ER positive at 100%, PR-positive at 6%, HER-2/neu is not amplified and her proliferation index is low at 7%. Her closest margin is 2 mm on the original lumpectomy. I took some more tissue and this is well clear. She decided to forgo radiation therapy and to begin arimidex.  She has some fatigue from this but is otherwise well.  She has begun dieting, water aerobics with good results.  She comes back in today for another postop check.   Review of Systems     Objective:   Physical Exam    right breast without mass, incision healed without infection Assessment:     Stage I right breast cancer s/p lumpectomy/arimidex    Plan:     She is doing well  She is due to visit her granddaughter soon and will turn 77 soon as well.  I will see her annually now.  She will continue self exams, annual mm.

## 2012-06-21 ENCOUNTER — Other Ambulatory Visit: Payer: Self-pay | Admitting: Radiology

## 2012-06-21 DIAGNOSIS — C50911 Malignant neoplasm of unspecified site of right female breast: Secondary | ICD-10-CM

## 2012-06-24 ENCOUNTER — Other Ambulatory Visit: Payer: Medicare Other

## 2012-07-02 ENCOUNTER — Other Ambulatory Visit: Payer: Self-pay | Admitting: Physician Assistant

## 2012-07-02 DIAGNOSIS — C50419 Malignant neoplasm of upper-outer quadrant of unspecified female breast: Secondary | ICD-10-CM

## 2012-07-03 ENCOUNTER — Other Ambulatory Visit (HOSPITAL_BASED_OUTPATIENT_CLINIC_OR_DEPARTMENT_OTHER): Payer: Medicare Other | Admitting: Lab

## 2012-07-03 ENCOUNTER — Telehealth: Payer: Self-pay | Admitting: *Deleted

## 2012-07-03 ENCOUNTER — Ambulatory Visit (HOSPITAL_BASED_OUTPATIENT_CLINIC_OR_DEPARTMENT_OTHER): Payer: Medicare Other | Admitting: Physician Assistant

## 2012-07-03 ENCOUNTER — Encounter: Payer: Self-pay | Admitting: Physician Assistant

## 2012-07-03 VITALS — BP 158/71 | HR 74 | Temp 97.9°F | Resp 20 | Ht 64.5 in | Wt 240.3 lb

## 2012-07-03 DIAGNOSIS — Z170 Estrogen receptor positive status [ER+]: Secondary | ICD-10-CM

## 2012-07-03 DIAGNOSIS — C50419 Malignant neoplasm of upper-outer quadrant of unspecified female breast: Secondary | ICD-10-CM

## 2012-07-03 DIAGNOSIS — C50919 Malignant neoplasm of unspecified site of unspecified female breast: Secondary | ICD-10-CM

## 2012-07-03 LAB — COMPREHENSIVE METABOLIC PANEL (CC13)
ALT: 28 U/L (ref 0–55)
AST: 23 U/L (ref 5–34)
Alkaline Phosphatase: 91 U/L (ref 40–150)
CO2: 25 mEq/L (ref 22–29)
Sodium: 140 mEq/L (ref 136–145)
Total Bilirubin: 0.5 mg/dL (ref 0.20–1.20)
Total Protein: 6.4 g/dL (ref 6.4–8.3)

## 2012-07-03 LAB — CBC WITH DIFFERENTIAL/PLATELET
BASO%: 0.2 % (ref 0.0–2.0)
LYMPH%: 32.7 % (ref 14.0–49.7)
MCHC: 33.1 g/dL (ref 31.5–36.0)
MONO#: 0.8 10*3/uL (ref 0.1–0.9)
MONO%: 7.3 % (ref 0.0–14.0)
Platelets: 283 10*3/uL (ref 145–400)
RBC: 4.57 10*6/uL (ref 3.70–5.45)
WBC: 10.4 10*3/uL — ABNORMAL HIGH (ref 3.9–10.3)

## 2012-07-03 NOTE — Telephone Encounter (Signed)
GAVE PATIENT APPOINTMENT FOR BONE DENSITY AT [facility] ON 07-11-2012 AT 10:30AM

## 2012-07-03 NOTE — Telephone Encounter (Signed)
Bone Density in Oct or Nov at [facility]; labs in Jan 2014, 1 week before visit with GM   07-11-2012 AT 10:30AM BONE DENSITY AT [facility]  GAVE PATIENT APPOINTMENT FOR LAB ONE WEEK BEFORE THE MD APPOINTMENT

## 2012-07-03 NOTE — Progress Notes (Signed)
ID: Lorraine Murphy   DOB: 03/15/1935  MR#: [id number]  WUJ#:[id number]  HISTORY OF PRESENT ILLNESS: Lorraine Murphy (pronoiunced "Goin") palpated a mass in her right breast about a week before routine mammography was due 02/07/2012. She mentioned this at [facility] so her screening mammography was changed to diagnostic, and this showed a small well circumscribed mass in the right breast, without associated microcalcifications. This was palpable and mobile by exam. Ultrasound showed a hypoechoic lobulated mass measuring 1.0 cm. Core biopsy was performed May 9 and showed ([id number]) an invasive ductal carcinoma, grade 1, with a prognostic panel pending. Bilateral breast MRI was performed 02/17/2012 and confirmed an enhancing irregular mass measuring 1.3 cm in the right breast, with a 0.6 cm mass medial to that. There was no enlarged axillary or internal mammary adenopathy. The patient's subsequent history is as detailed below.  INTERVAL HISTORY: The patient returns today for followup of her right breast cancer. Interval history is remarkable for  Lorraine Murphy having started on anastrozole in late June. She had a difficult time tolerating the medication for the first month, complaining of fatigue and weakness. After approximately 4 or 5 weeks, however, these side effects seemed to resolve, and at this time she is tolerating the medication very well. She's having no significant hot flashes. She has no increased joint pain, and her energy level has improved. She is exercising regularly, doing water aerobics, and has also started losing weight with  [employer].  REVIEW OF SYSTEMS: Lorraine Murphy  denies any recent illnesses and has had no fevers or chills. No skin changes, and no signs of abnormal bleeding. She denies any excessive vaginal dryness. Her appetite is good and she denies nausea or change in bowel or bladder habits. She's had no increased cough or shortness of breath and denies chest pain. No abnormal headaches or dizziness. At  this time her anxiety and depression seems to be better controlled.  A detailed review of systems is otherwise noncontributory.    PAST MEDICAL HISTORY: Past Medical History  Diagnosis Date  . Hypertension   . Wears glasses   . H/O colonoscopy 2012  . H/O bone density study   . Incontinence   . Arthritis   . GERD (gastroesophageal reflux disease)   . Obesity   . Osteoarthritis   . Breast cancer 02/29/12    right breast lumpectomy=invasive ductal cs ,ER?PR=positive her2 neu=neg  . Skin cancer   . Bilateral cataracts     no surgery  . Anxiety   . Depression   . Schizophrenia 63 age    paranoid schzophrenia  . Diabetes mellitus     diet contolled    PAST SURGICAL HISTORY: Past Surgical History  Procedure Date  . Appendectomy   . Tonsilectomy, adenoidectomy, bilateral myringotomy and tubes   . Tubal ligation   . Spider vein injection   . Breast surgery     right breast lumpectomy    FAMILY HISTORY Family History  Problem Relation Age of Onset  . Lung cancer Father    the patient's father died at the age of 34, shortly after being diagnosed with lung cancer, in the setting of tobacco abuse. The patient's mother died at the age of 48, possibly from heart disease. The patient had 2 brothers. One of them died in infancy. Of the other has a history of skin cancers, not melanoma. The patient had one sister who died from complications of emphysema. There is no history of breast or ovarian cancer in the family.  GYNECOLOGIC HISTORY: Menarche age 33, first live birth age 80, she is GX P2, went through menopause approximately age 36, and used hormone replacement for 10-15 years, stopping several years ago.   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 She is divorced and lives at home with her beagle "Cossey." Her son Lorraine Murphy is a IT trainer and lives in [place]. Son Lorraine Murphy is a Stage manager in  [place]. The patient is a [employer]. She has 1 grandchild and 1 on the way  ADVANCED DIRECTIVES: No advanced directives in place. Though she does have mental health advanced directives.  HEALTH MAINTENANCE: History  Substance Use Topics  . Smoking status: Never Smoker   . Smokeless tobacco: Not on file  . Alcohol Use: Yes     socially      Colonoscopy: 2012  PAP: 2010?  Bone density: 2000, nl  Lipid panel:  No Known Allergies  Current Outpatient Prescriptions  Medication Sig Dispense Refill  . amLODipine (NORVASC) 5 MG tablet Take 5 mg by mouth daily.      Marland Kitchen anastrozole (ARIMIDEX) 1 MG tablet       . aspirin 81 MG tablet Take 81 mg by mouth daily.      Marland Kitchen atorvastatin (LIPITOR) 80 MG tablet Take 40 mg by mouth daily.      . Calcium Carbonate-Vitamin D (CALCIUM-VITAMIN D) 500-200 MG-UNIT per tablet Take 1 tablet by mouth daily.       . fish oil-omega-3 fatty acids 1000 MG capsule Take 2 g by mouth daily.      . haloperidol decanoate (HALDOL DECANOATE) 50 MG/ML injection every 28 (twenty-eight) days. monthly      . losartan-hydrochlorothiazide (HYZAAR) 50-12.5 MG per tablet       . Multiple Vitamin (MULTIVITAMIN) tablet Take 1 tablet by mouth daily.      . naproxen sodium (ANAPROX) 220 MG tablet Take 220 mg by mouth 2 (two) times daily with a meal.        OBJECTIVE: Middle-aged white woman who appears comfortable and in no acute distress Filed Vitals:   07/03/12 1317  BP: 158/71  Pulse: 74  Temp: 97.9 F (36.6 C)  Resp: 20     Body mass index is 40.61 kg/(m^2).    ECOG FS: 1 Filed Weights   07/03/12 1317  Weight: 240 lb 4.8 oz (108.999 kg)   Sclerae unicteric Oropharynx clear No cervical or supraclavicular adenopathy Lungs no rales or rhonchi Heart regular rate and rhythm Abd soft, obese, nontender; positive bowel sounds MSK no focal spinal tenderness No peripheral edema Neuro: nonfocal; normal affect, alert and oriented x3 Breasts: The right breast is status post  lumpectomy with well-healed incision and no signs of local recurrence. Axillae are benign bilaterally, no adenopathy The left breast is unremarkable.  LAB RESULTS: Lab Results  Component Value Date   WBC 10.4* 07/03/2012   NEUTROABS 5.8 07/03/2012   HGB 14.1 07/03/2012   HCT 42.5 07/03/2012   MCV 93.0 07/03/2012   PLT 283 07/03/2012      Chemistry      Component Value Date/Time   NA 143 02/28/2012 1200   K 4.1 02/28/2012 1200   CL 106 02/28/2012 1200   CO2 25 02/28/2012 1200   BUN 13 02/28/2012 1200   CREATININE 0.81 02/28/2012 1200      Component Value Date/Time   CALCIUM 10.1 02/28/2012 1200   ALKPHOS 89 02/22/2012 1255  AST 20 02/22/2012 1255   ALT 19 02/22/2012 1255   BILITOT 0.2* 02/22/2012 1255       Lab Results  Component Value Date   LABCA2 38 02/22/2012     STUDIES:  No results found.    ASSESSMENT: 77 y.o.  [place] woman   (1)  status post right lumpectomy 02/29/2012 for a pT1b cN0, stage IA invasive ductal carcinoma, grade 1, estrogen receptor 100% positive, progesterone receptor 6% positive, with an MIB-1 of 7%, and no HER-2 amplification.   (2)  began on anastrozole, 03/29/2012  PLAN:  Lorraine Murphy will continue on anastrazole as before.  We will get a baseline bone density prior to her next appointment here, her last bone density having been and 2000. She will return to see [place] in approximately 3 months with repeat labs and followup exam, if she continues to tolerate treatment well, we will likely go to a q. 6 month followup schedule. She knows to call prior that time with any changes or problems.   Lorraine Murphy    07/03/2012

## 2012-07-03 NOTE — Patient Instructions (Signed)
No changes in our treatment plan.  Continue on anastrazole daily.  Bone Density sometime in next couple of months to evaluate for osteoporosis.    Return in Jan 2014 for repeat labs and follow up visit.  Call with any questions or problems ([phone])

## 2012-07-13 ENCOUNTER — Other Ambulatory Visit: Payer: Self-pay

## 2012-10-02 ENCOUNTER — Other Ambulatory Visit (HOSPITAL_BASED_OUTPATIENT_CLINIC_OR_DEPARTMENT_OTHER): Payer: Medicare Other | Admitting: Lab

## 2012-10-02 DIAGNOSIS — C50419 Malignant neoplasm of upper-outer quadrant of unspecified female breast: Secondary | ICD-10-CM

## 2012-10-02 LAB — COMPREHENSIVE METABOLIC PANEL (CC13)
ALT: 27 U/L (ref 0–55)
AST: 21 U/L (ref 5–34)
BUN: 22 mg/dL (ref 7.0–26.0)
Creatinine: 1 mg/dL (ref 0.6–1.1)
Total Bilirubin: 0.53 mg/dL (ref 0.20–1.20)

## 2012-10-02 LAB — CBC WITH DIFFERENTIAL/PLATELET
BASO%: 0.5 % (ref 0.0–2.0)
EOS%: 3.9 % (ref 0.0–7.0)
HCT: 43.1 % (ref 34.8–46.6)
LYMPH%: 35.4 % (ref 14.0–49.7)
MCH: 32.6 pg (ref 25.1–34.0)
MCHC: 34.4 g/dL (ref 31.5–36.0)
MCV: 94.9 fL (ref 79.5–101.0)
NEUT%: 53.2 % (ref 38.4–76.8)
Platelets: 272 10*3/uL (ref 145–400)

## 2012-10-09 ENCOUNTER — Telehealth: Payer: Self-pay | Admitting: Oncology

## 2012-10-09 ENCOUNTER — Ambulatory Visit (HOSPITAL_BASED_OUTPATIENT_CLINIC_OR_DEPARTMENT_OTHER): Payer: Medicare Other | Admitting: Oncology

## 2012-10-09 VITALS — BP 133/80 | HR 80 | Temp 97.8°F | Resp 20 | Ht 64.5 in | Wt 240.5 lb

## 2012-10-09 DIAGNOSIS — Z170 Estrogen receptor positive status [ER+]: Secondary | ICD-10-CM

## 2012-10-09 DIAGNOSIS — C50919 Malignant neoplasm of unspecified site of unspecified female breast: Secondary | ICD-10-CM

## 2012-10-09 DIAGNOSIS — C50419 Malignant neoplasm of upper-outer quadrant of unspecified female breast: Secondary | ICD-10-CM

## 2012-10-09 NOTE — Telephone Encounter (Signed)
gv pt appt schedule for May. °

## 2012-10-09 NOTE — Progress Notes (Signed)
ID: Lorraine Murphy   DOB: May 11, 1935  MR#: [id number]  FAO#:[id number]  PCP: Lorraine Alken, MD GYN: SUEmelia Murphy OTHER MD: Lorraine Murphy  HISTORY OF PRESENT ILLNESS: Lorraine Murphy (pronoiunced "Goin") palpated a mass in her right breast about a week before routine mammography was due 02/07/2012. She mentioned this at [facility] so her screening mammography was changed to diagnostic, and this showed a small well circumscribed mass in the right breast, without associated microcalcifications. This was palpable and mobile by exam. Ultrasound showed a hypoechoic lobulated mass measuring 1.0 cm. Core biopsy was performed May 9 and showed ([id number]) an invasive ductal carcinoma, grade 1, with a prognostic panel pending. Bilateral breast MRI was performed 02/17/2012 and confirmed an enhancing irregular mass measuring 1.3 cm in the right breast, with a 0.6 cm mass medial to that. There was no enlarged axillary or internal mammary adenopathy. The patient's subsequent history is as detailed below.  INTERVAL HISTORY: The patient returns today for followup of her right breast cancer. Interval history is unremarkable. She had a good time over the holidays, visiting her sons, and is expecting to have cataract surgery sometime in the fall of this year.  REVIEW OF SYSTEMS: When she started the anastrozole she felt quite tired, but after about 6 weeks her energy pick back up and she is now back to normal. She is doing water aerobics of regularly. She is worried because she's got have to do some downsizing sometime soon. She would prefer not to move. She's had no unusual headaches, nausea, vomiting, cough, phlegm production, or change in bowel or bladder habits. She has no pain, no significant vaginal dryness issues, and no significant hot flashes. A detailed review of systems today was noncontributory.  PAST MEDICAL HISTORY: Past Medical History  Diagnosis Date  . Hypertension   . Wears glasses   . H/O  colonoscopy 2012  . H/O bone density study   . Incontinence   . Arthritis   . GERD (gastroesophageal reflux disease)   . Obesity   . Osteoarthritis   . Breast cancer 02/29/12    right breast lumpectomy=invasive ductal cs ,ER?PR=positive her2 neu=neg  . Skin cancer   . Bilateral cataracts     no surgery  . Anxiety   . Depression   . Schizophrenia 78 age    paranoid schzophrenia  . Diabetes mellitus     diet contolled    PAST SURGICAL HISTORY: Past Surgical History  Procedure Date  . Appendectomy   . Tonsilectomy, adenoidectomy, bilateral myringotomy and tubes   . Tubal ligation   . Spider vein injection   . Breast surgery     right breast lumpectomy    FAMILY HISTORY Family History  Problem Relation Age of Onset  . Lung cancer Father    the patient's father died at the age of 76, shortly after being diagnosed with lung cancer, in the setting of tobacco abuse. The patient's mother died at the age of 63, possibly from heart disease. The patient had 2 brothers. One of them died in infancy. Of the other has a history of skin cancers, not melanoma. The patient had one sister who died from complications of emphysema. There is no history of breast or ovarian cancer in the family.  GYNECOLOGIC HISTORY: Menarche age 56, first live birth age 64, she is GX P2, went through menopause approximately age 33, and used hormone replacement for 10-15 years, stopping several years ago.   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 She is divorced and lives at home with her beagle "Lorraine Murphy." Her son Lorraine Murphy is a IT trainer and lives in [place]. Son Lorraine Murphy is a Stage manager in [place]. The patient is a [employer]. She has 1 grandchild and 1 on the way  ADVANCED DIRECTIVES: No advanced directives in place. Though she does have mental health advanced directives.  HEALTH MAINTENANCE: History  Substance Use Topics  .  Smoking status: Never Smoker   . Smokeless tobacco: Not on file  . Alcohol Use: Yes     Comment: socially      Colonoscopy: 2012  PAP: 2010?  Bone density: 2000, nl  Lipid panel:  No Known Allergies  Current Outpatient Prescriptions  Medication Sig Dispense Refill  . amLODipine (NORVASC) 5 MG tablet Take 5 mg by mouth daily.      Marland Kitchen anastrozole (ARIMIDEX) 1 MG tablet       . aspirin 81 MG tablet Take 81 mg by mouth daily.      Marland Kitchen atorvastatin (LIPITOR) 80 MG tablet Take 40 mg by mouth daily.      . Calcium Carbonate-Vitamin D (CALCIUM-VITAMIN D) 500-200 MG-UNIT per tablet Take 1 tablet by mouth daily.       . fish oil-omega-3 fatty acids 1000 MG capsule Take 2 g by mouth daily.      . haloperidol decanoate (HALDOL DECANOATE) 50 MG/ML injection every 28 (twenty-eight) days. monthly      . losartan-hydrochlorothiazide (HYZAAR) 50-12.5 MG per tablet       . Multiple Vitamin (MULTIVITAMIN) tablet Take 1 tablet by mouth daily.      . naproxen sodium (ANAPROX) 220 MG tablet Take 220 mg by mouth 2 (two) times daily with a meal.        OBJECTIVE: Elderly white woman in no acute distress Filed Vitals:   10/09/12 1303  BP: 133/80  Pulse: 80  Temp: 97.8 F (36.6 C)  Resp: 20     Body mass index is 40.64 kg/(m^2).    ECOG FS: 1 Filed Weights   10/09/12 1303  Weight: 240 lb 8 oz (109.09 kg)   Sclerae unicteric Oropharynx clear No cervical or supraclavicular adenopathy Lungs no rales or rhonchi Heart regular rate and rhythm Abd soft, obese, nontender; positive bowel sounds MSK kyphosis but no focal spinal tenderness Neuro: nonfocal; positive affect, alert and oriented x3 Breasts: The right breast is status post lumpectomy with no signs of local recurrence. The right axilla is benign.The left breast is unremarkable.  LAB RESULTS: Lab Results  Component Value Date   WBC 7.8 10/02/2012   NEUTROABS 4.1 10/02/2012   HGB 14.8 10/02/2012   HCT 43.1 10/02/2012   MCV 94.9 10/02/2012     PLT 272 10/02/2012      Chemistry      Component Value Date/Time   NA 142 10/02/2012 0956   NA 143 02/28/2012 1200   K 4.3 10/02/2012 0956   K 4.1 02/28/2012 1200   CL 106 10/02/2012 0956   CL 106 02/28/2012 1200   CO2 28 10/02/2012 0956   CO2 25 02/28/2012 1200   BUN 22.0 10/02/2012 0956   BUN 13 02/28/2012 1200   CREATININE 1.0 10/02/2012 0956   CREATININE 0.81 02/28/2012 1200      Component Value Date/Time   CALCIUM 10.4 10/02/2012 0956   CALCIUM 10.1 02/28/2012 1200   ALKPHOS 93 10/02/2012 0956   ALKPHOS 89 02/22/2012 1255  AST 21 10/02/2012 0956   AST 20 02/22/2012 1255   ALT 27 10/02/2012 0956   ALT 19 02/22/2012 1255   BILITOT 0.53 10/02/2012 0956   BILITOT 0.2* 02/22/2012 1255       Lab Results  Component Value Date   LABCA2 39 10/02/2012     STUDIES:  No results found. Bone density October 2013 was normal    ASSESSMENT: 78 y.o.  [place] woman   (1)  status post right lumpectomy 02/29/2012 for a pT1b cN0, stage IA invasive ductal carcinoma, grade 1, estrogen receptor 100% positive, progesterone receptor 6% positive, with an MIB-1 of 7%, and no HER-2 amplification.   (2)  began on anastrozole, 03/29/2012  PLAN:  The plan is to continue anastrozole for a total of 5 years. She will see [place] again in may of this year, and then she will see Dr. Dwain Sarna in August. She will see [place] again in November. She knows to call for any problems that may develop before those visits.  MAGRINAT,GUSTAV C    10/09/2012

## 2013-02-04 ENCOUNTER — Other Ambulatory Visit: Payer: Self-pay | Admitting: Physician Assistant

## 2013-02-04 DIAGNOSIS — C50419 Malignant neoplasm of upper-outer quadrant of unspecified female breast: Secondary | ICD-10-CM

## 2013-02-05 ENCOUNTER — Other Ambulatory Visit (HOSPITAL_BASED_OUTPATIENT_CLINIC_OR_DEPARTMENT_OTHER): Payer: Medicare Other | Admitting: Lab

## 2013-02-05 ENCOUNTER — Telehealth: Payer: Self-pay | Admitting: Oncology

## 2013-02-05 ENCOUNTER — Ambulatory Visit (HOSPITAL_BASED_OUTPATIENT_CLINIC_OR_DEPARTMENT_OTHER): Payer: Medicare Other | Admitting: Oncology

## 2013-02-05 VITALS — BP 123/72 | HR 82 | Temp 99.1°F | Resp 20 | Ht 64.0 in | Wt 233.5 lb

## 2013-02-05 DIAGNOSIS — C50419 Malignant neoplasm of upper-outer quadrant of unspecified female breast: Secondary | ICD-10-CM

## 2013-02-05 DIAGNOSIS — C50912 Malignant neoplasm of unspecified site of left female breast: Secondary | ICD-10-CM

## 2013-02-05 DIAGNOSIS — Z170 Estrogen receptor positive status [ER+]: Secondary | ICD-10-CM

## 2013-02-05 DIAGNOSIS — C50919 Malignant neoplasm of unspecified site of unspecified female breast: Secondary | ICD-10-CM

## 2013-02-05 LAB — COMPREHENSIVE METABOLIC PANEL (CC13)
ALT: 23 U/L (ref 0–55)
AST: 21 U/L (ref 5–34)
Albumin: 3.5 g/dL (ref 3.5–5.0)
Calcium: 10.9 mg/dL — ABNORMAL HIGH (ref 8.4–10.4)
Chloride: 104 mEq/L (ref 98–107)
Potassium: 4 mEq/L (ref 3.5–5.1)
Sodium: 143 mEq/L (ref 136–145)

## 2013-02-05 LAB — CBC WITH DIFFERENTIAL/PLATELET
BASO%: 0.3 % (ref 0.0–2.0)
Basophils Absolute: 0 10*3/uL (ref 0.0–0.1)
HCT: 44.3 % (ref 34.8–46.6)
HGB: 14.9 g/dL (ref 11.6–15.9)
LYMPH%: 33.3 % (ref 14.0–49.7)
MCHC: 33.6 g/dL (ref 31.5–36.0)
MONO#: 0.6 10*3/uL (ref 0.1–0.9)
NEUT%: 58.2 % (ref 38.4–76.8)
Platelets: 263 10*3/uL (ref 145–400)
WBC: 10.1 10*3/uL (ref 3.9–10.3)

## 2013-02-05 MED ORDER — ANASTROZOLE 1 MG PO TABS: 1.0000 mg | ORAL_TABLET | Freq: Every day | ORAL | Status: DC

## 2013-02-05 NOTE — Telephone Encounter (Signed)
, °

## 2013-02-05 NOTE — Progress Notes (Signed)
ID: Lorraine Murphy   DOB: [birth year]  MR#: [id number]  UXL#:[id number]  PCP: Lorraine Alken, MD GYN: SUEmelia Murphy OTHER MD: Lorraine Murphy  HISTORY OF PRESENT ILLNESS: Lorraine Murphy (pronoiunced "Goin") palpated a mass in her right breast about a week before routine mammography was due 02/07/2012. She mentioned this at [facility] so her screening mammography was changed to diagnostic, and this showed a small well circumscribed mass in the right breast, without associated microcalcifications. This was palpable and mobile by exam. Ultrasound showed a hypoechoic lobulated mass measuring 1.0 cm. Core biopsy was performed May 9 and showed ([id number]) an invasive ductal carcinoma, grade 1, with a prognostic panel pending. Bilateral breast MRI was performed 02/17/2012 and confirmed an enhancing irregular mass measuring 1.3 cm in the right breast, with a 0.6 cm mass medial to that. There was no enlarged axillary or internal mammary adenopathy. The patient's subsequent history is as detailed below.  INTERVAL HISTORY: Lorraine Murphy returns today for followup of her right breast cancer. The interval history is significant for her having joined [employer]. She is losing weight. She is going to water aerobics 3 times a week, and trying to do some walking in between.  REVIEW OF SYSTEMS: She is tolerating the anastrozole with no side effects that she is aware of, and in particular no achiness, no problems with vaginal dryness, and no hot flashes. She tells me her diabetes is under better control. She will need cataract surgery soon. She has some stress urinary incontinence which is chronic. Otherwise a detailed review of systems today was noncontributory.  PAST MEDICAL HISTORY: Past Medical History  Diagnosis Date  . Hypertension   . Wears glasses   . H/O colonoscopy 2012  . H/O bone density study   . Incontinence   . Arthritis   . GERD (gastroesophageal reflux disease)   . Obesity   .  Osteoarthritis   . Breast cancer 02/29/12    right breast lumpectomy=invasive ductal cs ,ER?PR=positive her2 neu=neg  . Skin cancer   . Bilateral cataracts     no surgery  . Anxiety   . Depression   . Schizophrenia 78 age    paranoid schzophrenia  . Diabetes mellitus     diet contolled    PAST SURGICAL HISTORY: Past Surgical History  Procedure Laterality Date  . Appendectomy    . Tonsilectomy, adenoidectomy, bilateral myringotomy and tubes    . Tubal ligation    . Spider vein injection    . Breast surgery      right breast lumpectomy    FAMILY HISTORY Family History  Problem Relation Age of Onset  . Lung cancer Father    the patient's father died at the age of 76, shortly after being diagnosed with lung cancer, in the setting of tobacco abuse. The patient's mother died at the age of 1, possibly from heart disease. The patient had 2 brothers. One of them died in infancy. Of the other has a history of skin cancers, not melanoma. The patient had one sister who died from complications of emphysema. There is no history of breast or ovarian cancer in the family.  GYNECOLOGIC HISTORY: Menarche age 78, first live birth age 78, she is GX P2, went through menopause approximately age 78, and used hormone replacement for 10-15 years, stopping several years ago.   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 She is divorced and lives at home with her beagle "Cossey."  Her son Lorraine Murphy is a IT trainer and lives in [place]. Son Lorraine Murphy is a Stage manager in [place]. The patient is a [employer]. She has 1 grandchild and 1 on the way  ADVANCED DIRECTIVES: No advanced directives in place. Though she does have mental health advanced directives.  HEALTH MAINTENANCE: History  Substance Use Topics  . Smoking status: Never Smoker   . Smokeless tobacco: Not on file  . Alcohol Use: Yes     Comment: socially       Colonoscopy: 2012  PAP: 2010?  Bone density: 2000, nl  Lipid panel:  No Known Allergies  Current Outpatient Prescriptions  Medication Sig Dispense Refill  . amLODipine (NORVASC) 5 MG tablet Take 5 mg by mouth daily.      Marland Kitchen anastrozole (ARIMIDEX) 1 MG tablet       . aspirin 81 MG tablet Take 81 mg by mouth daily.      Marland Kitchen atorvastatin (LIPITOR) 80 MG tablet Take 40 mg by mouth daily.      . Calcium Carbonate-Vitamin D (CALCIUM-VITAMIN D) 500-200 MG-UNIT per tablet Take 1 tablet by mouth daily.       . fish oil-omega-3 fatty acids 1000 MG capsule Take 2 g by mouth daily.      . haloperidol decanoate (HALDOL DECANOATE) 50 MG/ML injection every 28 (twenty-eight) days. monthly      . losartan-hydrochlorothiazide (HYZAAR) 50-12.5 MG per tablet       . Multiple Vitamin (MULTIVITAMIN) tablet Take 1 tablet by mouth daily.      . naproxen sodium (ANAPROX) 220 MG tablet Take 220 mg by mouth 2 (two) times daily with a meal.       No current facility-administered medications for this visit.    OBJECTIVE: Elderly white woman in no acute distress Filed Vitals:   02/05/13 1320  BP: 123/72  Pulse: 82  Temp: 99.1 F (37.3 C)  Resp: 20     Body mass index is 40.06 kg/(m^2).    ECOG FS: 1 Filed Weights   02/05/13 1320  Weight: 233 lb 8 oz (105.915 kg)   Sclerae unicteric Oropharynx clear No cervical or supraclavicular adenopathy Lungs no rales or rhonchi Heart regular rate and rhythm Abd soft, obese, nontender; positive bowel sounds MSK kyphosis but no focal spinal tenderness Neuro: nonfocal; pleasant affect, well oriented Breasts: The right breast is status post lumpectomy with no signs of local recurrence. The right axilla is benign.The left breast is unremarkable.  LAB RESULTS: Lab Results  Component Value Date   WBC 10.1 02/05/2013   NEUTROABS 5.9 02/05/2013   HGB 14.9 02/05/2013   HCT 44.3 02/05/2013   MCV 92.8 02/05/2013   PLT 263 02/05/2013      Chemistry      Component Value  Date/Time   NA 142 10/02/2012 0956   NA 143 02/28/2012 1200   K 4.3 10/02/2012 0956   K 4.1 02/28/2012 1200   CL 106 10/02/2012 0956   CL 106 02/28/2012 1200   CO2 28 10/02/2012 0956   CO2 25 02/28/2012 1200   BUN 22.0 10/02/2012 0956   BUN 13 02/28/2012 1200   CREATININE 1.0 10/02/2012 0956   CREATININE 0.81 02/28/2012 1200      Component Value Date/Time   CALCIUM 10.4 10/02/2012 0956   CALCIUM 10.1 02/28/2012 1200   ALKPHOS 93 10/02/2012 0956   ALKPHOS 89 02/22/2012 1255   AST 21 10/02/2012 0956   AST 20 02/22/2012 1255   ALT 27  10/02/2012 0956   ALT 19 02/22/2012 1255   BILITOT 0.53 10/02/2012 0956   BILITOT 0.2* 02/22/2012 1255       Lab Results  Component Value Date   LABCA2 39 10/02/2012     STUDIES:  No new results found. Bone density October 2013 was normal  ASSESSMENT: 78 y.o.  [place] woman   (1)  status post right lumpectomy 02/29/2012 for a pT1b cN0, stage IA invasive ductal carcinoma, grade 1, estrogen receptor 100% positive, progesterone receptor 6% positive, with an MIB-1 of 7%, and no HER-2 amplification.   (2)  began on anastrozole, 03/29/2012; bone density October 2013 was normal  PLAN:  She is doing terrific from a breast cancer point of view, and has an excellent prognosis. She will see Dr. Dwain Sarna in 3 months and see [place] again in 6 months. After the November visit, we will start seeing her on a once a year basis, right after her yearly mammography. She knows to call for any problems that may develop before the next visit. Coutney Wildermuth C    02/05/2013

## 2013-05-20 ENCOUNTER — Encounter (INDEPENDENT_AMBULATORY_CARE_PROVIDER_SITE_OTHER): Payer: Self-pay | Admitting: General Surgery

## 2013-05-20 ENCOUNTER — Ambulatory Visit (INDEPENDENT_AMBULATORY_CARE_PROVIDER_SITE_OTHER): Payer: Medicare Other | Admitting: General Surgery

## 2013-05-20 VITALS — BP 138/82 | HR 64 | Temp 98.0°F | Resp 14 | Ht 65.0 in | Wt 234.2 lb

## 2013-05-20 DIAGNOSIS — C50419 Malignant neoplasm of upper-outer quadrant of unspecified female breast: Secondary | ICD-10-CM

## 2013-05-20 DIAGNOSIS — C50411 Malignant neoplasm of upper-outer quadrant of right female breast: Secondary | ICD-10-CM

## 2013-05-20 NOTE — Progress Notes (Signed)
Subjective:     Patient ID: Lorraine Murphy, female   DOB: [birth year], 78 y.o.   MRN: [id number]  HPI This is a 78 year old female who I took care of for a right breast cancer with a right breast lumpectomy. She has done well from that. We decided to proceed with antiestrogen therapy alone. She has done very well. She had some trouble the first month on that but is now done well. She has some only occasional hot flashes now. She has no complaints referable to either breast at this point. Since I last saw her she has another granddaughter now as well.  Review of Systems  Constitutional: Negative for fever, chills and unexpected weight change.  HENT: Negative for hearing loss, congestion, sore throat, trouble swallowing and voice change.   Eyes: Negative for visual disturbance.  Respiratory: Negative for cough and wheezing.   Cardiovascular: Negative for chest pain, palpitations and leg swelling.  Gastrointestinal: Negative for nausea, vomiting, abdominal pain, diarrhea, constipation, blood in stool, abdominal distention and anal bleeding.  Genitourinary: Negative for hematuria, vaginal bleeding and difficulty urinating.  Musculoskeletal: Negative for arthralgias.  Skin: Negative for rash and wound.  Neurological: Negative for seizures, syncope and headaches.  Hematological: Negative for adenopathy. Does not bruise/bleed easily.  Psychiatric/Behavioral: Negative for confusion.        Objective:   Physical Exam  Vitals reviewed. Constitutional: She appears well-developed and well-nourished.  Neck: Neck supple.  Pulmonary/Chest: Right breast exhibits no inverted nipple, no mass, no nipple discharge, no skin change and no tenderness. Left breast exhibits no inverted nipple, no mass, no nipple discharge, no skin change and no tenderness.    Lymphadenopathy:    She has no cervical adenopathy.    She has no axillary adenopathy.       Right: No supraclavicular adenopathy present.       Left:  No supraclavicular adenopathy present.       Assessment:  Right breast cancer    Plan:     She has no clinical evidence of recurrence. She is doing well overall. She has a normal mammogram in June and will followup in one year. She is tolerating her antiestrogen therapy. I will see her back in one year. She is going to continue her own self exams and get her mammogram again next June.

## 2013-08-13 ENCOUNTER — Other Ambulatory Visit (HOSPITAL_BASED_OUTPATIENT_CLINIC_OR_DEPARTMENT_OTHER): Payer: Medicare Other

## 2013-08-13 ENCOUNTER — Encounter (INDEPENDENT_AMBULATORY_CARE_PROVIDER_SITE_OTHER): Payer: Self-pay

## 2013-08-13 DIAGNOSIS — C50912 Malignant neoplasm of unspecified site of left female breast: Secondary | ICD-10-CM

## 2013-08-13 DIAGNOSIS — C50919 Malignant neoplasm of unspecified site of unspecified female breast: Secondary | ICD-10-CM

## 2013-08-13 LAB — CBC WITH DIFFERENTIAL/PLATELET
BASO%: 0.7 % (ref 0.0–2.0)
EOS%: 2.9 % (ref 0.0–7.0)
HCT: 42.9 % (ref 34.8–46.6)
MCH: 30.7 pg (ref 25.1–34.0)
MCHC: 32.6 g/dL (ref 31.5–36.0)
NEUT%: 54.1 % (ref 38.4–76.8)
RDW: 13.4 % (ref 11.2–14.5)
lymph#: 3.2 10*3/uL (ref 0.9–3.3)

## 2013-08-13 LAB — COMPREHENSIVE METABOLIC PANEL (CC13)
ALT: 23 U/L (ref 0–55)
AST: 19 U/L (ref 5–34)
Alkaline Phosphatase: 89 U/L (ref 40–150)
Calcium: 10.6 mg/dL — ABNORMAL HIGH (ref 8.4–10.4)
Chloride: 108 mEq/L (ref 98–109)
Creatinine: 0.9 mg/dL (ref 0.6–1.1)

## 2013-08-20 ENCOUNTER — Encounter: Payer: Self-pay | Admitting: Physician Assistant

## 2013-08-20 ENCOUNTER — Telehealth: Payer: Self-pay | Admitting: Oncology

## 2013-08-20 ENCOUNTER — Ambulatory Visit (HOSPITAL_BASED_OUTPATIENT_CLINIC_OR_DEPARTMENT_OTHER): Payer: Medicare Other | Admitting: Physician Assistant

## 2013-08-20 VITALS — BP 128/78 | HR 73 | Temp 97.5°F | Resp 18 | Ht 65.0 in | Wt 236.9 lb

## 2013-08-20 DIAGNOSIS — Z853 Personal history of malignant neoplasm of breast: Secondary | ICD-10-CM

## 2013-08-20 DIAGNOSIS — Z170 Estrogen receptor positive status [ER+]: Secondary | ICD-10-CM

## 2013-08-20 DIAGNOSIS — C50411 Malignant neoplasm of upper-outer quadrant of right female breast: Secondary | ICD-10-CM

## 2013-08-20 DIAGNOSIS — Z780 Asymptomatic menopausal state: Secondary | ICD-10-CM

## 2013-08-20 DIAGNOSIS — C50919 Malignant neoplasm of unspecified site of unspecified female breast: Secondary | ICD-10-CM

## 2013-08-20 NOTE — Progress Notes (Signed)
ID: VALARIE FARACE   DOB: 03/17/1935  MR#: [id number]  HYQ#:[id number]  PCP: Gaye Alken, MD GYN: SUEmelia Loron, MD OTHER MD: Antony Blackbird, MD;  Bernette Redbird, MD   CHIEF COMPLAINT:  Right Breast Cancer   HISTORY OF PRESENT ILLNESS: Ms. Bunning (pronoiunced "Goin") palpated a mass in her right breast about a week before routine mammography was due 02/07/2012. She mentioned this at [facility] so her screening mammography was changed to diagnostic, and this showed a small well circumscribed mass in the right breast, without associated microcalcifications. This was palpable and mobile by exam. Ultrasound showed a hypoechoic lobulated mass measuring 1.0 cm. Core biopsy was performed May 9 and showed ([id number]) an invasive ductal carcinoma, grade 1, with a prognostic panel pending. Bilateral breast MRI was performed 02/17/2012 and confirmed an enhancing irregular mass measuring 1.3 cm in the right breast, with a 0.6 cm mass medial to that. There was no enlarged axillary or internal mammary adenopathy.   The patient's subsequent history is as detailed below.  INTERVAL HISTORY: Yvett returns today for followup of her right breast cancer. She is feeling well today with no new complaints. She continues on her anastrozole with good tolerance. She has no significant hot flashes. She's had no vaginal dryness, and denies any increased joint pain.   The interval history is generally unremarkable. Verniece is planning her Christmas holidays, and plans to spend it with both sons, their wives, and her two granddaughters, ages 60 months and 21 months.   REVIEW OF SYSTEMS: Chundra denies any recent illnesses and has had no fevers or chills. She's had no skin changes and denies any abnormal leading. Her energy level is fair. Her appetite is good she denies any nausea or change in bowel or bladder habits. She's had no increased cough, increased shortness of breath, chest pain, or palpitations. She's had no  abnormal headaches or dizziness. She is planning to have cataract surgery in the next few weeks. She currently denies any new or unusual myalgias, arthralgias, or bony pain. She occasionally has some muscular pain in the left scapular area, and this improves with massage. She's had no peripheral swelling.  A detailed review of systems is otherwise stable and noncontributory.  PAST MEDICAL HISTORY: Past Medical History  Diagnosis Date  . Hypertension   . Wears glasses   . H/O colonoscopy 2012  . H/O bone density study   . Incontinence   . Arthritis   . GERD (gastroesophageal reflux disease)   . Obesity   . Osteoarthritis   . Breast cancer 02/29/12    right breast lumpectomy=invasive ductal cs ,ER?PR=positive her2 neu=neg  . Skin cancer   . Bilateral cataracts     no surgery  . Anxiety   . Depression   . 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 Diabetes mellitus     diet contolled  . Hyperlipidemia   . Cancer     PAST SURGICAL HISTORY: Past Surgical History  Procedure Laterality Date  . Appendectomy    . Tonsilectomy, adenoidectomy, bilateral myringotomy and tubes    . Tubal ligation    . Spider vein injection    . Breast surgery      right breast lumpectomy    FAMILY HISTORY Family History  Problem Relation Age of Onset  . Lung cancer Father    the patient's father died at the age of 54, shortly after being diagnosed with lung cancer, in the setting of tobacco abuse. The patient's  mother died at the age of 2, possibly from heart disease. The patient had 2 brothers. One of them died in infancy. Of the other has a history of skin cancers, not melanoma. The patient had one sister who died from complications of emphysema. There is no history of breast or ovarian cancer in the family.  GYNECOLOGIC HISTORY: Menarche age 63, first live birth age 12, she is GX P2, went through menopause approximately age 78, and used hormone replacement for 10-15 years, stopping  78 years ago.   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 She is divorced and lives at home with her beagle "Cossey." Her son [name] is a IT trainer and lives in [place]. Son [name] is a Stage manager in [place]. The patient is a [employer]. She has 2 granddaughters, one 76-month-old and one 50 months old.  ADVANCED DIRECTIVES: No advanced directives in place. Though she does have mental health advanced directives.  HEALTH MAINTENANCE: (Updated November 2014) History  Substance Use Topics  . Smoking status: Never Smoker   . Smokeless tobacco: Never Used  . Alcohol Use: Yes     Comment: socially      Colonoscopy: 2011/Buccini  PAP: 2010?  Bone density: October 2013 at [facility], normal  Lipid panel:UTD, Dr. Zachery Dauer   No Known Allergies  Current Outpatient Prescriptions  Medication Sig Dispense Refill  . amLODipine (NORVASC) 5 MG tablet Take 5 mg by mouth daily.      Marland Kitchen anastrozole (ARIMIDEX) 1 MG tablet Take 1 tablet (1 mg total) by mouth daily.  90 tablet  12  . aspirin 81 MG tablet Take 81 mg by mouth daily.      Marland Kitchen atorvastatin (LIPITOR) 80 MG tablet Take 40 mg by mouth daily.      . fish oil-omega-3 fatty acids 1000 MG capsule Take 2 g by mouth daily.      . Glycerin-Polysorbate 80 (REFRESH DRY EYE THERAPY OP) Apply to eye daily as needed.      . haloperidol decanoate (HALDOL DECANOATE) 50 MG/ML injection every 28 (twenty-eight) days. monthly      . losartan-hydrochlorothiazide (HYZAAR) 50-12.5 MG per tablet       . Multiple Vitamin (MULTIVITAMIN) tablet Take 1 tablet by mouth daily.      . naproxen sodium (ANAPROX) 220 MG tablet Take 220 mg by mouth 2 (two) times daily with a meal.       No current facility-administered medications for this visit.    OBJECTIVE: Elderly white woman in no acute distress Filed Vitals:   08/20/13 1351  BP: 128/78  Pulse: 73  Temp: 97.5  F (36.4 C)  Resp: 18     Body mass index is 39.42 kg/(m^2).    ECOG FS: 1 Filed Weights   08/20/13 1351  Weight: 236 lb 14.4 oz (107.457 kg)   Physical Exam: HEENT:  Sclerae anicteric.  Oropharynx clear. Buccal mucosa is pink and moist. NODES:  No cervical or supraclavicular lymphadenopathy palpated.  BREAST EXAM: Right breast is status post lumpectomy with no evidence of local recurrence. Left breast is unremarkable. Axillae are benign bilaterally with no palpable lymphadenopathy. LUNGS:  Clear to auscultation bilaterally.  No wheezes or rhonchi. HEART:  Regular rate and rhythm. No murmur  ABDOMEN:  Soft, obese, nontender.  Positive bowel sounds.  MSK:  No focal spinal tenderness to palpation. Good range of motion bilaterally in the upper extremities. No  joint swelling. EXTREMITIES:  No peripheral edema.   NEURO:  Nonfocal. Well oriented.  Positive affect.   LAB RESULTS: Lab Results  Component Value Date   WBC 9.6 08/13/2013   NEUTROABS 5.2 08/13/2013   HGB 14.0 08/13/2013   HCT 42.9 08/13/2013   MCV 94.0 08/13/2013   PLT 301 08/13/2013      Chemistry      Component Value Date/Time   NA 143 08/13/2013 1238   NA 143 02/28/2012 1200   K 4.3 08/13/2013 1238   K 4.1 02/28/2012 1200   CL 104 02/05/2013 1249   CL 106 02/28/2012 1200   CO2 26 08/13/2013 1238   CO2 25 02/28/2012 1200   BUN 21.5 08/13/2013 1238   BUN 13 02/28/2012 1200   CREATININE 0.9 08/13/2013 1238   CREATININE 0.81 02/28/2012 1200      Component Value Date/Time   CALCIUM 10.6* 08/13/2013 1238   CALCIUM 10.1 02/28/2012 1200   ALKPHOS 89 08/13/2013 1238   ALKPHOS 89 02/22/2012 1255   AST 19 08/13/2013 1238   AST 20 02/22/2012 1255   ALT 23 08/13/2013 1238   ALT 19 02/22/2012 1255   BILITOT 0.26 08/13/2013 1238   BILITOT 0.2* 02/22/2012 1255       Lab Results  Component Value Date   LABCA2 39 10/02/2012     STUDIES:  Most recent bilateral mammogram at [facility] on 03/05/2013 was unremarkable.  Most  recent bone density on 07/09/2012 at [facility] was normal.   ASSESSMENT: 78 y.o.  [place] woman   (1)  status post right lumpectomy 02/29/2012 for a pT1b cN0, stage IA invasive ductal carcinoma, grade 1, estrogen receptor 100% positive, progesterone receptor 6% positive, with an MIB-1 of 7%, and no HER-2 amplification.   (2)  began on anastrozole, 03/29/2012; bone density October 2013 was normal  PLAN:  Karolee is doing very well with regards to her breast cancer, with no clinical evidence of disease recurrence. She is tolerating the anastrozole well, and we are making no changes to her current regimen. The plan is to continue the anastrozole for total of 5 years, until June of 2018.  Malaiah is scheduled to see her primary care physician, Dr. Zachery Dauer, next week. She does have a history of mild hypercalcemia which she tells me Dr. Zachery Dauer has been following "for a long time". We will make sure they have a copy of her office note today, and also her recent lab report for further evaluation.   Xoey is due for her next annual mammogram at [facility] in June. She'll see Dr. Darnelle Catalan soon thereafter for routine six-month followup, with labs and physical exam. From that point forward, we will plan on seeing her on an annual basis, soon after her annual mammograms.   Akira Perusse PA-C    08/20/2013

## 2013-12-30 ENCOUNTER — Telehealth: Payer: Self-pay | Admitting: *Deleted

## 2013-12-30 NOTE — Telephone Encounter (Signed)
Per POF 3/29 I have moved appt. Patient called and aware. Calendar mailed

## 2014-01-29 ENCOUNTER — Other Ambulatory Visit: Payer: Self-pay | Admitting: Physician Assistant

## 2014-03-10 ENCOUNTER — Telehealth: Payer: Self-pay | Admitting: Physician Assistant

## 2014-03-10 NOTE — Telephone Encounter (Signed)
pt cld and cld reurned to pt in re to r/s appt. Adv pt that we coud r/s to 6/17 labs & 6/24 appt-Pt aware of times for both appts

## 2014-03-11 ENCOUNTER — Other Ambulatory Visit: Payer: Self-pay | Admitting: Oncology

## 2014-03-11 DIAGNOSIS — C50419 Malignant neoplasm of upper-outer quadrant of unspecified female breast: Secondary | ICD-10-CM

## 2014-03-12 ENCOUNTER — Other Ambulatory Visit: Payer: Self-pay | Admitting: *Deleted

## 2014-03-12 ENCOUNTER — Other Ambulatory Visit: Payer: Self-pay | Admitting: Oncology

## 2014-03-12 DIAGNOSIS — C50419 Malignant neoplasm of upper-outer quadrant of unspecified female breast: Secondary | ICD-10-CM

## 2014-03-12 MED ORDER — ANASTROZOLE 1 MG PO TABS: ORAL_TABLET | ORAL | Status: DC

## 2014-03-17 ENCOUNTER — Other Ambulatory Visit: Payer: Medicare Other

## 2014-03-18 ENCOUNTER — Other Ambulatory Visit: Payer: Self-pay | Admitting: *Deleted

## 2014-03-18 DIAGNOSIS — C50419 Malignant neoplasm of upper-outer quadrant of unspecified female breast: Secondary | ICD-10-CM

## 2014-03-19 ENCOUNTER — Other Ambulatory Visit (HOSPITAL_BASED_OUTPATIENT_CLINIC_OR_DEPARTMENT_OTHER): Payer: Medicare Other

## 2014-03-19 DIAGNOSIS — C50919 Malignant neoplasm of unspecified site of unspecified female breast: Secondary | ICD-10-CM

## 2014-03-19 DIAGNOSIS — C50419 Malignant neoplasm of upper-outer quadrant of unspecified female breast: Secondary | ICD-10-CM

## 2014-03-19 LAB — CBC WITH DIFFERENTIAL/PLATELET
BASO%: 0.8 % (ref 0.0–2.0)
BASOS ABS: 0.1 10*3/uL (ref 0.0–0.1)
EOS%: 3 % (ref 0.0–7.0)
Eosinophils Absolute: 0.3 10*3/uL (ref 0.0–0.5)
HCT: 41.5 % (ref 34.8–46.6)
HEMOGLOBIN: 13.6 g/dL (ref 11.6–15.9)
LYMPH#: 3.1 10*3/uL (ref 0.9–3.3)
LYMPH%: 30.6 % (ref 14.0–49.7)
MCH: 30.2 pg (ref 25.1–34.0)
MCHC: 32.9 g/dL (ref 31.5–36.0)
MCV: 91.7 fL (ref 79.5–101.0)
MONO#: 0.7 10*3/uL (ref 0.1–0.9)
MONO%: 7.1 % (ref 0.0–14.0)
NEUT#: 6 10*3/uL (ref 1.5–6.5)
NEUT%: 58.5 % (ref 38.4–76.8)
Platelets: 318 10*3/uL (ref 145–400)
RBC: 4.52 10*6/uL (ref 3.70–5.45)
RDW: 13.8 % (ref 11.2–14.5)
WBC: 10.2 10*3/uL (ref 3.9–10.3)

## 2014-03-19 LAB — COMPREHENSIVE METABOLIC PANEL (CC13)
ALBUMIN: 3.3 g/dL — AB (ref 3.5–5.0)
ALT: 18 U/L (ref 0–55)
ANION GAP: 10 meq/L (ref 3–11)
AST: 16 U/L (ref 5–34)
Alkaline Phosphatase: 94 U/L (ref 40–150)
BUN: 21.7 mg/dL (ref 7.0–26.0)
CALCIUM: 10.3 mg/dL (ref 8.4–10.4)
CHLORIDE: 108 meq/L (ref 98–109)
CO2: 27 meq/L (ref 22–29)
CREATININE: 0.9 mg/dL (ref 0.6–1.1)
Glucose: 112 mg/dl (ref 70–140)
POTASSIUM: 3.8 meq/L (ref 3.5–5.1)
Sodium: 144 mEq/L (ref 136–145)
Total Bilirubin: 0.36 mg/dL (ref 0.20–1.20)
Total Protein: 6.5 g/dL (ref 6.4–8.3)

## 2014-03-24 ENCOUNTER — Encounter: Payer: Medicare Other | Admitting: Physician Assistant

## 2014-03-25 ENCOUNTER — Other Ambulatory Visit: Payer: Self-pay | Admitting: Physician Assistant

## 2014-03-25 NOTE — Progress Notes (Signed)
Patient's appointment on 03/24/2014 was canceled, and rescheduled to 03/26/2014.  Micah Flesher, PA-C 03/25/2014

## 2014-03-26 ENCOUNTER — Encounter: Payer: Self-pay | Admitting: Physician Assistant

## 2014-03-26 ENCOUNTER — Telehealth: Payer: Self-pay | Admitting: Physician Assistant

## 2014-03-26 ENCOUNTER — Ambulatory Visit (HOSPITAL_BASED_OUTPATIENT_CLINIC_OR_DEPARTMENT_OTHER): Payer: Medicare Other | Admitting: Physician Assistant

## 2014-03-26 VITALS — BP 134/80 | HR 64 | Temp 97.9°F | Resp 20 | Ht 65.0 in | Wt 243.2 lb

## 2014-03-26 DIAGNOSIS — C50411 Malignant neoplasm of upper-outer quadrant of right female breast: Secondary | ICD-10-CM

## 2014-03-26 DIAGNOSIS — Z780 Asymptomatic menopausal state: Secondary | ICD-10-CM | POA: Insufficient documentation

## 2014-03-26 DIAGNOSIS — Z853 Personal history of malignant neoplasm of breast: Secondary | ICD-10-CM

## 2014-03-26 DIAGNOSIS — Z79811 Long term (current) use of aromatase inhibitors: Secondary | ICD-10-CM

## 2014-03-26 MED ORDER — ANASTROZOLE 1 MG PO TABS: ORAL_TABLET | ORAL | Status: DC

## 2014-03-26 NOTE — Telephone Encounter (Signed)
per pof to sch pt appt-BD -mamma-pt already had BD sch for 10/8 @1 -cannot sch mamma-sch not opened for 07/2015-[facility] states will notify pt of next mamma-gave pt copy of sch for 1 yr app

## 2014-03-26 NOTE — Progress Notes (Signed)
ID: Lorraine Murphy   DOB: 1934-10-24  MR#: [id number]  VPX#:[id number]  PCP: Lorraine Heck, MD GYN: Lorraine Bookbinder, MD OTHER MD: Lorraine Pray, MD;  Lorraine Lobo, MD   CHIEF COMPLAINT:  Hx of Right Breast Cancer (anastrazole)   HISTORY OF PRESENT ILLNESS: Lorraine Murphy (pronoiunced "Goin") palpated a mass in her right breast about a week before routine mammography was due 02/07/2012. She mentioned this at [facility] so her screening mammography was changed to diagnostic, and this showed a small well circumscribed mass in the right breast, without associated microcalcifications. This was palpable and mobile by exam.   Ultrasound showed a hypoechoic lobulated mass measuring 1.0 cm. Core biopsy was performed May 9 and showed ([id number]) an invasive ductal carcinoma, grade 1, with a prognostic panel pending. Bilateral breast MRI was performed 02/17/2012 and confirmed an enhancing irregular mass measuring 1.3 cm in the right breast, with a 0.6 cm mass medial to that. There was no enlarged axillary or internal mammary adenopathy.   The patient's subsequent history is as detailed below.  INTERVAL HISTORY: Lorraine Murphy returns alone today for followup of her right breast cancer. The she continues on anastrozole with very good tolerance. She has no significant hot flashes. She has some arthritic joint pain, but nothing that has worsened. She does have some problems walking, but this is secondary to peripheral neuropathy associated with diabetes. She denies any vaginal changes, specifically no vaginal dryness.  Interval history is generally unremarkable. She just returned from [place] visiting her 33 month old granddaughter. After waiting until age 79, she now has 2 granddaughters, ages 59 months and 77 months, and a third is due in October, also a girl. Lorraine Murphy busy traveling on the train between [place] in [place] to visit. She does exercise on a regular basis as well doing water  aerobics.  REVIEW OF SYSTEMS: Lorraine Murphy denies any recent illnesses and has had no fevers, night sweats, or chills. Lorraine Murphy any skin changes, rashes, abnormal bruising, or bleeding. Her energy level is fairly good. Her appetite is good. She denies any problems with nausea or emesis and has had no change in bowel habits. She has some mild urinary incontinence which is chronic and stable. She denies dysuria or hematuria. She's had no increased cough, phlegm production, shortness of breath, peripheral swelling, chest pain, or palpitations. She denies any abnormal headaches, weakness, or dizziness. She recently had cataract procedures in her vision has improved. Currently, she denies any new or unusual myalgias, arthralgias, or bony pain.  A detailed review of systems is otherwise stable and noncontributory.    PAST MEDICAL HISTORY: Past Medical History  Diagnosis Date  . Hypertension   . Wears glasses   . H/O colonoscopy 2012  . H/O bone density study   . Incontinence   . Arthritis   . GERD (gastroesophageal reflux disease)   . Obesity   . Osteoarthritis   . Breast cancer 02/29/12    right breast lumpectomy=invasive ductal cs ,ER?PR=positive her2 neu=neg  . Skin cancer   . Bilateral cataracts     no surgery  . Anxiety   . Depression   . Schizophrenia 19 age    paranoid schzophrenia  . Diabetes mellitus     diet contolled  . Hyperlipidemia   . Cancer     PAST SURGICAL HISTORY: Past Surgical History  Procedure Laterality Date  . Appendectomy    . Tonsilectomy, adenoidectomy, bilateral myringotomy and tubes    . Tubal ligation    .  Spider vein injection    . Breast surgery      right breast lumpectomy    FAMILY HISTORY Family History  Problem Relation Age of Onset  . Lung cancer Father    the patient's father died at the age of 45, shortly after being diagnosed with lung cancer, in the setting of tobacco abuse. The patient's mother died at the age of 14, possibly from heart  disease. The patient had 2 brothers. One of them died in infancy. Of the other has a history of skin cancers, not melanoma. The patient had one sister who died from complications of emphysema. There is no history of breast or ovarian cancer in the family.  GYNECOLOGIC HISTORY:  (Reviewed 03/26/2014)  Menarche age 79, first live birth age 4, she is GX P2, went through menopause approximately age 71, and used hormone replacement for 10-15 years, stopping several years ago.   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 She is divorced and lives at home with her beagle "Lorraine Murphy." Her son Lorraine Murphy is a Engineer, maintenance (IT) and lives in [place]. He has one Murphy (77 yrs old as of June 2015) and another Murphy due later this year. Son Lorraine Murphy is a Sports coach in [place]. Lorraine Murphy, 37 months old as of June 2015.  The patient is a [employer].    ADVANCED DIRECTIVES: No advanced directives in place. Though she does have mental health advanced directives.   HEALTH MAINTENANCE: (Updated 03/26/2014) History  Substance Use Topics  . Smoking status: Never Smoker   . Smokeless tobacco: Never Used  . Alcohol Use: Yes     Comment: socially      Colonoscopy: 2011/Lorraine Murphy  PAP: Not on file  Bone density: October 2013 at [facility], normal  Lipid panel:UTD, Dr. Drema Murphy   No Known Allergies  Current Outpatient Prescriptions  Medication Sig Dispense Refill  . amLODipine (NORVASC) 5 MG tablet Take 5 mg by mouth daily.      Marland Kitchen anastrozole (ARIMIDEX) 1 MG tablet TAKE 1 TABLET BY MOUTH DAILY  90 tablet  3  . aspirin 81 MG tablet Take 81 mg by mouth daily.      Marland Kitchen atorvastatin (LIPITOR) 80 MG tablet Take 40 mg by mouth daily.      . fish oil-omega-3 fatty acids 1000 MG capsule Take 2 g by mouth daily.      . Glycerin-Polysorbate 80 (REFRESH DRY EYE THERAPY OP) Apply to eye daily as  needed.      . haloperidol decanoate (HALDOL DECANOATE) 50 MG/ML injection every 28 (twenty-eight) days. monthly      . losartan-hydrochlorothiazide (HYZAAR) 50-12.5 MG per tablet       . prednisoLONE acetate (PRED FORTE) 1 % ophthalmic suspension        No current facility-administered medications for this visit.    OBJECTIVE: Elderly white woman who appears well and is in no acute distress Filed Vitals:   03/26/14 0833  BP: 134/80  Pulse: 64  Temp: 97.9 F (36.6 C)  Resp: 20     Body mass index is 40.47 kg/(m^2).    ECOG FS: 1 Filed Weights   03/26/14 0833  Weight: 243 lb 3.2 oz (110.315 kg)   Physical Exam: HEENT:  Sclerae anicteric.  Oropharynx clear. Buccal mucosa is pink and moist. Neck supple, and trachea midline. No thyromegaly palpated.  NODES:  No  cervical or supraclavicular lymphadenopathy palpated.  BREAST EXAM: Right breast is status post lumpectomy with no  palpable nodularity and no skin changes. No evidence of local recurrence. Left breast is unremarkable. Axillae are benign bilaterally with no palpable lymphadenopathy. LUNGS:  Clear to auscultation bilaterallywith good excursion .  No wheezes or rhonchi. HEART:  Regular rate and rhythm. No murmur  appreciated.  ABDOMEN:  Soft, obese, nontender.   no organomegaly or masses palpated.Positive bowel sounds.  MSK:  No focal spinal tenderness to palpation. Good range of motion bilaterally in the upper extremities.  EXTREMITIES:  No peripheral edema.   no lymphedema in the right upper extremity.  SKIN: No visible rashes. No excessive ecchymoses. No petechiae. No pallor. Skin is warm and dry. Good skin turgor.    NEURO:  Nonfocal. Well oriented.  Appropriate  affect.   LAB RESULTS: Lab Results  Component Value Date   WBC 10.2 03/19/2014   NEUTROABS 6.0 03/19/2014   HGB 13.6 03/19/2014   HCT 41.5 03/19/2014   MCV 91.7 03/19/2014   PLT 318 03/19/2014      Chemistry      Component Value Date/Time   NA 144 03/19/2014  1305   NA 143 02/28/2012 1200   K 3.8 03/19/2014 1305   K 4.1 02/28/2012 1200   CL 104 02/05/2013 1249   CL 106 02/28/2012 1200   CO2 27 03/19/2014 1305   CO2 25 02/28/2012 1200   BUN 21.7 03/19/2014 1305   BUN 13 02/28/2012 1200   CREATININE 0.9 03/19/2014 1305   CREATININE 0.81 02/28/2012 1200      Component Value Date/Time   CALCIUM 10.3 03/19/2014 1305   CALCIUM 10.1 02/28/2012 1200   ALKPHOS 94 03/19/2014 1305   ALKPHOS 89 02/22/2012 1255   AST 16 03/19/2014 1305   AST 20 02/22/2012 1255   ALT 18 03/19/2014 1305   ALT 19 02/22/2012 1255   BILITOT 0.36 03/19/2014 1305   BILITOT 0.2* 02/22/2012 1255        STUDIES:  Most recent bilateral mammogram at [facility] on 03/06/2014 was unremarkable.  Most recent bone density on 07/09/2012 at [facility] was normal.   ASSESSMENT: 79 y.o.   woman   (1)  status post right lumpectomy 02/29/2012 for a pT1b cN0, stage IA invasive ductal carcinoma, grade 1, estrogen receptor 100% positive, progesterone receptor 6% positive, with an MIB-1 of 7%, and no HER-2 amplification.   (2)  began on anastrozole, 03/29/2012; bone density October 2013 was normal  PLAN:  Appolonia  appears to be doing very well, and there is no clinical evidence of disease recurrence at this time. Her recent mammogram was unremarkable and will be repeated in one year. I'm making no changes in her current regimen, and I have refilled her anastrozole for another year.  We will repeat her bone density in October to assess for development of osteoporosis.   The plan is to continue for total of 5 years, until June 2018, and we will continue seeing her now on an annual basis for labs and physical exam he each June. In the meanwhile, she will continue to be followed closely by her primary care physician, Dr. Drema Murphy, and we will forward a copy of Debbra's lab report and office progress note from today to Dr. Drema Murphy for her review.  All the above was reviewed with the patient today. She voices her  understanding and agreement with this plan, and she knows to call prior to her appointment next June should  she have any changes or problems.   BERRY,AMY PA-C    03/26/2014

## 2014-06-13 ENCOUNTER — Ambulatory Visit (INDEPENDENT_AMBULATORY_CARE_PROVIDER_SITE_OTHER): Payer: Medicare Other | Admitting: General Surgery

## 2014-08-04 ENCOUNTER — Encounter: Payer: Self-pay | Admitting: Physician Assistant

## 2014-08-15 ENCOUNTER — Encounter: Payer: Self-pay | Admitting: Oncology

## 2015-03-12 ENCOUNTER — Other Ambulatory Visit: Payer: Self-pay | Admitting: Oncology

## 2015-03-13 NOTE — Telephone Encounter (Signed)
Last ov 03/27/15.  Next ov 03/30/15.  Chart reviewed.

## 2015-03-30 ENCOUNTER — Ambulatory Visit (HOSPITAL_BASED_OUTPATIENT_CLINIC_OR_DEPARTMENT_OTHER): Payer: Medicare Other | Admitting: Oncology

## 2015-03-30 ENCOUNTER — Telehealth: Payer: Self-pay | Admitting: Oncology

## 2015-03-30 ENCOUNTER — Other Ambulatory Visit (HOSPITAL_BASED_OUTPATIENT_CLINIC_OR_DEPARTMENT_OTHER): Payer: Medicare Other

## 2015-03-30 VITALS — BP 137/56 | HR 72 | Temp 98.0°F | Resp 18 | Ht 65.0 in | Wt 242.2 lb

## 2015-03-30 DIAGNOSIS — Z170 Estrogen receptor positive status [ER+]: Secondary | ICD-10-CM | POA: Diagnosis not present

## 2015-03-30 DIAGNOSIS — C50411 Malignant neoplasm of upper-outer quadrant of right female breast: Secondary | ICD-10-CM | POA: Diagnosis not present

## 2015-03-30 DIAGNOSIS — Z79811 Long term (current) use of aromatase inhibitors: Secondary | ICD-10-CM | POA: Diagnosis not present

## 2015-03-30 DIAGNOSIS — Z853 Personal history of malignant neoplasm of breast: Secondary | ICD-10-CM

## 2015-03-30 LAB — COMPREHENSIVE METABOLIC PANEL (CC13)
ALT: 21 U/L (ref 0–55)
ANION GAP: 7 meq/L (ref 3–11)
AST: 19 U/L (ref 5–34)
Albumin: 3.5 g/dL (ref 3.5–5.0)
Alkaline Phosphatase: 85 U/L (ref 40–150)
BILIRUBIN TOTAL: 0.41 mg/dL (ref 0.20–1.20)
BUN: 22.9 mg/dL (ref 7.0–26.0)
CO2: 28 meq/L (ref 22–29)
Calcium: 10.3 mg/dL (ref 8.4–10.4)
Chloride: 108 mEq/L (ref 98–109)
Creatinine: 0.9 mg/dL (ref 0.6–1.1)
EGFR: 58 mL/min/{1.73_m2} — ABNORMAL LOW (ref >90)
GLUCOSE: 93 mg/dL (ref 70–140)
POTASSIUM: 4.1 meq/L (ref 3.5–5.1)
Sodium: 143 mEq/L (ref 136–145)
TOTAL PROTEIN: 6.4 g/dL (ref 6.4–8.3)

## 2015-03-30 LAB — CBC WITH DIFFERENTIAL/PLATELET
BASO%: 0.2 % (ref 0.0–2.0)
Basophils Absolute: 0 10*3/uL (ref 0.0–0.1)
EOS%: 4.8 % (ref 0.0–7.0)
Eosinophils Absolute: 0.4 10*3/uL (ref 0.0–0.5)
HCT: 40.6 % (ref 34.8–46.6)
HEMOGLOBIN: 13.1 g/dL (ref 11.6–15.9)
LYMPH%: 39.2 % (ref 14.0–49.7)
MCH: 29.6 pg (ref 25.1–34.0)
MCHC: 32.3 g/dL (ref 31.5–36.0)
MCV: 91.9 fL (ref 79.5–101.0)
MONO#: 0.7 10*3/uL (ref 0.1–0.9)
MONO%: 8.9 % (ref 0.0–14.0)
NEUT%: 46.9 % (ref 38.4–76.8)
NEUTROS ABS: 3.8 10*3/uL (ref 1.5–6.5)
Platelets: 291 10*3/uL (ref 145–400)
RBC: 4.42 10*6/uL (ref 3.70–5.45)
RDW: 14 % (ref 11.2–14.5)
WBC: 8.1 10*3/uL (ref 3.9–10.3)
lymph#: 3.2 10*3/uL (ref 0.9–3.3)

## 2015-03-30 MED ORDER — ANASTROZOLE 1 MG PO TABS: 1.0000 mg | ORAL_TABLET | Freq: Every day | ORAL | Status: DC

## 2015-03-30 NOTE — Telephone Encounter (Signed)
Sent message to schedule survivorship clinc

## 2015-03-30 NOTE — Progress Notes (Signed)
ID: Lorraine Murphy   DOB: 1935-08-02  MR#: [id number]  IWP#:[id number]  PCP: Gerrit Heck, MD GYN: SURolm Bookbinder, MD OTHER MD: Gery Pray, MD;  Ronald Lobo, MD   CHIEF COMPLAINT:  Hx of Right Breast Cancer (anastrazole)   HISTORY OF PRESENT ILLNESS:  From the original intake note:  Lorraine Murphy (pronoiunced "Goin") palpated a mass in her right breast about a week before routine mammography was due 02/07/2012. She mentioned this at [facility] so her screening mammography was changed to diagnostic, and this showed a small well circumscribed mass in the right breast, without associated microcalcifications. This was palpable and mobile by exam.   Ultrasound showed a hypoechoic lobulated mass measuring 1.0 cm. Core biopsy was performed May 9 and showed ([id number]) an invasive ductal carcinoma, grade 1, with a prognostic panel pending. Bilateral breast MRI was performed 02/17/2012 and confirmed an enhancing irregular mass measuring 1.3 cm in the right breast, with a 0.6 cm mass medial to that. There was no enlarged axillary or internal mammary adenopathy.   The patient's subsequent history is as detailed below.  INTERVAL HISTORY: Lorraine Murphy returns today for follow-up of her breast cancer. The interval history is significant for her son, Lorraine Murphy, to have moved to [place]. He is working for [employer]. He help the patient when a tree fell on her house recently.--She continues on anastrozole, generally with good tolerance. Hot flashes and vaginal dryness are not major concerns. She has obtained the medicine at good cost.  REVIEW OF SYSTEMS: Lorraine Murphy had cataract surgery, which went well. She tells me her diabetes is well controlled. "[place] [place]ob[place]. She is not walking much otherwise and wonders if she needs one of those" chair/walkers". A detailed review of systems today was otherwise stable.    PAST MEDICAL HISTORY: Past Medical History   Diagnosis Date  . Hypertension   . Wears glasses   . H/O colonoscopy 2012  . H/O bone density study   . Incontinence   . Arthritis   . GERD (gastroesophageal reflux disease)   . Obesity   . Osteoarthritis   . Breast cancer 02/29/12    right breast lumpectomy=invasive ductal cs ,ER?PR=positive her2 neu=neg  . Skin cancer   . Bilateral cataracts     no surgery  . Anxiety   . Depression   . 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 Diabetes mellitus     diet contolled  . Hyperlipidemia   . Cancer     PAST SURGICAL HISTORY: Past Surgical History  Procedure Laterality Date  . Appendectomy    . Tonsilectomy, adenoidectomy, bilateral myringotomy and tubes    . Tubal ligation    . Spider vein injection    . Breast surgery      right breast lumpectomy    FAMILY HISTORY Family History  Problem Relation Age of Onset  . Lung cancer Father    the patient's father died at the age of 55, shortly after being diagnosed with lung cancer, in the setting of tobacco abuse. The patient's mother died at the age of 69, possibly from heart disease. The patient had 2 brothers. One of them died in infancy. Of the other has a history of skin cancers, not melanoma. The patient had one sister who died from complications of emphysema. There is no history of breast or ovarian cancer in the family.  GYNECOLOGIC HISTORY:  (Reviewed 03/26/2014)  Menarche age  56, first live birth age 44, she is GX P2, went through menopause approximately age 30, and used hormone replacement for 10-15 years, stopping several years ago.   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 She is divorced and lives at home with her beagle "[name]." Her son Lorraine Murphy is a Engineer, maintenance (IT) and lives in [place]. He has 2 daughters. Son Lorraine Murphy is a Sports coach in [place]. Lorraine Murphy also has a daughter, 89 months  old as of June 2015.  The patient is a [employer].    ADVANCED DIRECTIVES: No advanced directives in place. Though she does have mental health advanced directives.   HEALTH MAINTENANCE: (Updated 03/26/2014) History  Substance Use Topics  . Smoking status: Never Smoker   . Smokeless tobacco: Never Used  . Alcohol Use: Yes     Comment: socially      Colonoscopy: 2011/[facility]  PAP: Not on file  Bone density: October 2013 at [facility], normal  Lipid panel:UTD, Dr. Drema Dallas   No Known Allergies  Current Outpatient Prescriptions  Medication Sig Dispense Refill  . amLODipine (NORVASC) 5 MG tablet Take 5 mg by mouth daily.    Marland Kitchen anastrozole (ARIMIDEX) 1 MG tablet Take 1 tablet (1 mg total) by mouth daily. 90 tablet 2  . aspirin 81 MG tablet Take 81 mg by mouth daily.    Marland Kitchen atorvastatin (LIPITOR) 80 MG tablet Take 40 mg by mouth daily.    . fish oil-omega-3 fatty acids 1000 MG capsule Take 2 g by mouth daily.    . Glycerin-Polysorbate 80 (REFRESH DRY EYE THERAPY OP) Apply to eye daily as needed.    . haloperidol decanoate (HALDOL DECANOATE) 50 MG/ML injection every 28 (twenty-eight) days. monthly    . losartan-hydrochlorothiazide (HYZAAR) 50-12.5 MG per tablet      No current facility-administered medications for this visit.    OBJECTIVE: Elderly white woman who appears stated age 80 Vitals:   03/30/15 1125  BP: 137/56  Pulse: 72  Temp: 98 F (36.7 C)  Resp: 18     Body mass index is 40.3 kg/(m^2).    ECOG FS: 1 Filed Weights   03/30/15 1125  Weight: 242 lb 3.2 oz (109.861 kg)   Sclerae unicteric, EOMs intact Oropharynx clear, dentition in good repair No cervical or supraclavicular adenopathy Lungs no rales or rhonchi Heart regular rate and rhythm Abd soft, obese, nontender, positive bowel sounds MSK no focal spinal tenderness, no upper extremity lymphedema Neuro: nonfocal, well oriented, appropriate affect Breasts: Status post right lumpectomy, with no evidence of disease  recurrence. The right axilla is benign. The left breast is unremarkable.    LAB RESULTS: Lab Results  Component Value Date   WBC 8.1 03/30/2015   NEUTROABS 3.8 03/30/2015   HGB 13.1 03/30/2015   HCT 40.6 03/30/2015   MCV 91.9 03/30/2015   PLT 291 03/30/2015      Chemistry      Component Value Date/Time   NA 143 03/30/2015 1110   NA 143 02/28/2012 1200   K 4.1 03/30/2015 1110   K 4.1 02/28/2012 1200   CL 104 02/05/2013 1249   CL 106 02/28/2012 1200   CO2 28 03/30/2015 1110   CO2 25 02/28/2012 1200   BUN 22.9 03/30/2015 1110   BUN 13 02/28/2012 1200   CREATININE 0.9 03/30/2015 1110   CREATININE 0.81 02/28/2012 1200      Component  Value Date/Time   CALCIUM 10.3 03/30/2015 1110   CALCIUM 10.1 02/28/2012 1200   ALKPHOS 85 03/30/2015 1110   ALKPHOS 89 02/22/2012 1255   AST 19 03/30/2015 1110   AST 20 02/22/2012 1255   ALT 21 03/30/2015 1110   ALT 19 02/22/2012 1255   BILITOT 0.41 03/30/2015 1110   BILITOT 0.2* 02/22/2012 1255        STUDIES: Mammography at [facility] with tomosynthesis 03/09/2015 showed stable posttreatment changes in the right breast and no evidence of malignancy.  ASSESSMENT: 80 y.o.  [place] woman   (1)  status post right lumpectomy 02/29/2012 for a pT1b cN0, stage IA invasive ductal carcinoma, grade 1, estrogen receptor 100% positive, progesterone receptor 6% positive, with an MIB-1 of 7%, and no HER-2 amplification.   (2)  began on anastrozole, 03/29/2012; bone density October 2013 was normal  PLAN:  Treazure is generally doing well. I have commended her exercise program and suggested she could extended some.  Her serum calcium is borderline high. I have suggested she stop taking vitamin D and calcium supplementations especially since she had a normal bone density when last checked  We discussed our new survivorship program and she is interested in participating. She will see our survivorship nurse practitioner in 1 year. She will see me again  in 2 years from now on at that point we will discuss whether she wants to continue on anastrozole a total of 10 years or "graduate" from follow-up here  Chauncey Cruel, MD     03/30/2015

## 2015-06-11 ENCOUNTER — Other Ambulatory Visit: Payer: Self-pay | Admitting: General Surgery

## 2015-06-11 DIAGNOSIS — C50411 Malignant neoplasm of upper-outer quadrant of right female breast: Secondary | ICD-10-CM

## 2015-09-11 ENCOUNTER — Telehealth: Payer: Self-pay | Admitting: Nurse Practitioner

## 2015-09-11 NOTE — Telephone Encounter (Signed)
Called and spoke with patient regarding upcoming appointment in Survivorship clinic.  Per Dr. Virgie Dad notes, patient to return in 1 year (June 2017) for evaluation.  Rescheduled patient's appointment to June 2017.  Will mail new copy of calendar with my contact information in the event pt has questions or needs evaluation prior to that time. Pt without questions at this time.

## 2015-09-14 ENCOUNTER — Encounter: Payer: Medicare Other | Admitting: Nurse Practitioner

## 2015-12-04 ENCOUNTER — Other Ambulatory Visit: Payer: Self-pay | Admitting: Oncology

## 2015-12-07 NOTE — Telephone Encounter (Signed)
Chart Reviewed.

## 2016-03-14 ENCOUNTER — Encounter: Payer: Self-pay | Admitting: Nurse Practitioner

## 2016-03-14 ENCOUNTER — Telehealth: Payer: Self-pay | Admitting: Oncology

## 2016-03-14 ENCOUNTER — Encounter: Payer: Medicare Other | Admitting: Nurse Practitioner

## 2016-03-14 ENCOUNTER — Ambulatory Visit (HOSPITAL_BASED_OUTPATIENT_CLINIC_OR_DEPARTMENT_OTHER): Payer: Medicare Other | Admitting: Nurse Practitioner

## 2016-03-14 VITALS — BP 122/66 | HR 72 | Temp 98.2°F | Resp 17 | Ht 65.0 in | Wt 234.7 lb

## 2016-03-14 DIAGNOSIS — Z170 Estrogen receptor positive status [ER+]: Secondary | ICD-10-CM

## 2016-03-14 DIAGNOSIS — C50411 Malignant neoplasm of upper-outer quadrant of right female breast: Secondary | ICD-10-CM

## 2016-03-14 DIAGNOSIS — Z780 Asymptomatic menopausal state: Secondary | ICD-10-CM

## 2016-03-14 NOTE — Progress Notes (Signed)
CLINIC:  Cancer Survivorship   REASON FOR VISIT:  Routine follow-up post-treatment for history of breast cancer.  BRIEF ONCOLOGIC HISTORY:    Primary cancer of upper outer quadrant of right female breast ([place])   02/29/2012 Definitive Surgery Right lumpectomy: IDC, grade 1, ER+ (100%), PR+ (6%), HER2/neu negative, Ki67 7%    Radiation Therapy Pt declined   03/29/2012 -  Anti-estrogen oral therapy Anastrozole 1 mg    INTERVAL HISTORY:  Lorraine Murphy presents to the [facility] today for ongoing follow up regarding her history of breast cancer. Overall, Lorraine Murphy reports feeling doing well since her last visit with Dr. Jana Hakim in June 2016. She continues on anastrozole and is tolerating this well. She denies joint pain or hot flashes.  She has recently begun using a cane due to weakness in her left knee.  She has not noticed any change within her breast and her last mammogram was last week and was unremarkable. She denies any headache, cough, shortness of breath, or bone pain. She reports a good appetite and denies any weight loss.    REVIEW OF SYSTEMS:  General: Denies fever, chills, unintentional weight loss, or generalized fatigue.  HEENT: Wears glasses. Denies visual changes, hearing loss, mouth sores, or difficulty swallowing. Cardiac: Denies palpitations and lower extremity edema.  Respiratory: Sleeps on 2 pillows at night. Denies wheeze or dyspnea on exertion.  Breast: As above.  GI: Denies abdominal pain, constipation, diarrhea, nausea, or vomiting.  GU: urinary incontinece, dribbling  Denies dysuria, hematuria, vaginal bleeding, vaginal discharge, or vaginal dryness.  Musculoskeletal:As above. Neuro: Denies recent fall or numbness / tingling in her extremities.  Skin: Denies rash, pruritis, or open wounds.  Psych: Denies depression, anxiety, insomnia, or memory loss.   A 14-point review of systems was completed and was negative, except as noted above.   ONCOLOGY  TREATMENT TEAM:  1. Surgeon:  Dr. Donne Hazel at [facility]  2. Medical Oncologist: Dr. Jana Hakim 3. Radiation Oncologist: Dr. Sondra Come    PAST MEDICAL/SURGICAL HISTORY:  Past Medical History  Diagnosis Date  . Hypertension   . Wears glasses   . H/O colonoscopy 2012  . H/O bone density study   . Incontinence   . Arthritis   . GERD (gastroesophageal reflux disease)   . Obesity   . Osteoarthritis   . Breast cancer ([place]) 02/29/12    right breast lumpectomy=invasive ductal cs ,ER?PR=positive her2 neu=neg  . Skin cancer   . Bilateral cataracts     no surgery  . Anxiety   . Depression   . Schizophrenia ([place]) 51 age    paranoid schzophrenia  . Diabetes mellitus     diet contolled  . Hyperlipidemia   . Cancer [facility])    Past Surgical History  Procedure Laterality Date  . Appendectomy    . Tonsilectomy, adenoidectomy, bilateral myringotomy and tubes    . Tubal ligation    . Spider vein injection    . Breast surgery      right breast lumpectomy     ALLERGIES:  No Known Allergies   CURRENT MEDICATIONS:  Current Outpatient Prescriptions on File Prior to Visit  Medication Sig Dispense Refill  . amLODipine (NORVASC) 5 MG tablet Take 5 mg by mouth daily.    Marland Kitchen anastrozole (ARIMIDEX) 1 MG tablet TAKE 1 TABLET (1 MG TOTAL) BY MOUTH DAILY. 90 tablet 3  . aspirin 81 MG tablet Take 81 mg by mouth daily.    Marland Kitchen atorvastatin (LIPITOR) 80 MG tablet  Take 40 mg by mouth daily.    . fish oil-omega-3 fatty acids 1000 MG capsule Take 2 g by mouth daily.    . Glycerin-Polysorbate 80 (REFRESH DRY EYE THERAPY OP) Apply to eye daily as needed.    . haloperidol decanoate (HALDOL DECANOATE) 50 MG/ML injection every 28 (twenty-eight) days. monthly    . losartan-hydrochlorothiazide (HYZAAR) 50-12.5 MG per tablet      No current facility-administered medications on file prior to visit.     ONCOLOGIC FAMILY HISTORY:  Family History  Problem Relation Age of Onset  . Lung cancer Father        GENETIC COUNSELING/TESTING: No   SOCIAL HISTORY:  Lorraine Murphy is divorced and lives alone in [place], [place].  She has 2 children. Lorraine Murphy is currently retired.  She denies any current or history of tobacco or illicit drug use. She uses alcohol on occasion.    PHYSICAL EXAMINATION:  Vital Signs: Filed Vitals:   03/14/16 1400  BP: 122/66  Pulse: 72  Temp: 98.2 F (36.8 C)  Resp: 17   Weight: 234.7 ECOG performance status: 1 General: Well-nourished, well-appearing female in no acute distress.  She is  Unaccompanied in clinic today.   HEENT: Head is atraumatic and normocephalic.  Pupils equal and reactive to light and accomodation. Conjunctivae clear without exudate.  Sclerae anicteric. Oral mucosa is pink, moist, and intact without lesions.  Oropharynx is pink without lesions or erythema.  Lymph: No cervical, supraclavicular, infraclavicular, or axillary lymphadenopathy noted on palpation.  Cardiovascular: Regular rate and rhythm without murmurs, rubs, or gallops. Respiratory: Clear to auscultation bilaterally. Chest expansion symmetric without accessory muscle use on inspiration or expiration.  Breast: Bilateral breast exam performed.  Right lumpectomy scar intact with nodularity.  No mass or lesion in either breast.   GI: Abdomen soft and round. No tenderness to palpation. Bowel sounds normoactive in 4 quadrants. No hepatosplenomegaly.   GU: Deferred.  Musculoskeletal: Muscle strength 5/5 in all extremities.   Neuro: No focal deficits. Steady gait.  Psych: Mood and affect normal and appropriate for situation.  Extremities: No edema, cyanosis, or clubbing.  Skin: Warm and dry. No open lesions noted.   LABORATORY DATA:  No results found for this or any previous visit (from the past 2160 hour(s)).  DIAGNOSTIC IMAGING: Bilateral diagnostic mammogram performed 03/10/2016 shows post op change associated with the right breast; surgical clips in the right breast with  no findings suspicious for malignancy bilaterally.     ASSESSMENT AND PLAN:   1. Breast cancer: Stage IA invasive ductal carcinoma of the right breast (02/2012), ER positive, PR positive, HER2/neu negative, S/P lumpectomy with pt declining radiation therapy; adjuvant endocrine therapy with anastrozole begun 03/2012.  Lorraine Murphy is doing well with no clinical symptoms worrisome for cancer recurrence at this time. I have reviewed the recommendations for ongoing surveillance with her and she will follow-up with her medical oncologist,  Dr. Jana Hakim, in June 2018 with history and physical exam per surveillance protocol.  She will be due mammogram in June 2018 and we will enter orders for this to be done prior to her follow up appointment next year.  She will continue her anti-estrogen therapy with anastrozole at this time.  She was instructed to make [place] aware if she notes any change within her breast, any new symptoms such as pain, shortness of breath, weight loss, or fatigue.  She will also report any new or increased side effects of the endocrine therapy or  any difficulties with it.     2. Bone health:  Given Lorraine Murphy's age/history of breast cancer and her current treatment regimen including endocrine therapy with anastrozole, she is at risk for bone demineralization.  Per our records, her last DEXA scan was performed in 07/2014 and was normal.  We will continue to monitor this closely for any further changes as a result of endocrine therapy. She will be due repeat imaging in October 2017 and we will enter orders for this to be done.  In the meantime, she was encouraged to increase her consumption of foods rich in calcium and vitamin D as well as to increase her weight-bearing activities.  She was given education on specific activities to promote bone health.  3. Cancer screening:  Due to Lorraine Murphy history and her age, she should receive screening for skin cancers, colon cancer, and gynecologic cancers.  The  information and recommendations were shared with the patient and in her written after visit summary.  4. Health maintenance and wellness promotion:Lorraine Murphy and I discussed recommendations to maximize nutrition and minimize recurrence, such as increased intake of fruits, vegetables, lean proteins, and minimizing the intake of red meats and processed foods.  She was also encouraged to engage in moderate to vigorous exercise for 30 minutes per day most days of the week. She was instructed to limit her alcohol consumption and continue to abstain from tobacco use.   5. Support services/counseling:Lorraine Murphy was offered support today through active listening and expressive supportive counseling.  A total of 30 minutes of face-to-face time was spent with this patient with greater than 50% of that time in counseling and care-coordination.   Sylvan Cheese, NP  Survivorship Program [facility] [phone]   Note: PRIMARY CARE PROVIDER Gerrit Heck, [place] [phone]

## 2016-03-14 NOTE — Telephone Encounter (Signed)
Gave pt apt & avs °

## 2016-03-14 NOTE — Patient Instructions (Addendum)
Thank you for coming in today!  As we discussed, please continue to perform your self breast exam and report any changes. If you note any new symptoms (please see below), be sure to notify us ASAP.  Your bone density test will be due in October 2017 and we will schedule that today.  Your mammogram will be due in June 2018 and we will enter orders for it today.  We'll have you return in one year's time with Dr. Jana Hakim (after your mammogram) for your next appointment or sooner if you have any problems. Please be sure to stop by scheduling on your way out to make those appointment(s).  Looking forward to working with you in the future!  Let us know if you have any questions!  Symptoms to Watch for and Report to Your Provider  . Return of the cancer symptoms you had before- such as a lump or new growth where your cancer first started . New or unusual pain that seems unrelated to an injury and does not go away, including back pain or bone pain . Weight loss without trying/intending . Unexplained bleeding . A rash or allergic reaction, such as swelling, severe itching or wheezing . Chills or fevers . Persistent headaches . Shortness of breath or difficulty breathing . Bloody stools or blood in your urine . Lumps, bumps, swelling and/or nipple discharge . Nausea, vomiting, diarrhea, loss of appetite, or trouble swallowing . A cough that doesn't go away . Abdominal pain . Swelling in your arms or legs . Fractures . Hot flashes or other menopausal symptoms . Any other signs mentioned by your doctor or nurse or any unusual symptoms                 that you just can't explain   NOTE: Just because you have certain symptoms, it doesn't mean the cancer has come back or you have a new cancer. Symptoms can be due to other problems that need to be addressed.  It is important to watch for these symptoms and report them to your provider so you can be medically evaluated for any of these concerns!     Living a  Life of Wellness After Cancer:  *Note: Please consult your health care provider before using any medications, supplements, over-the-counter products, or other interventions.  Also, please consult your primary care provider before you begin any lifestyle program (diet, exercise, etc.).  Your safety is our top priority and we want to make sure you continue to live a long and healthy life!    Healthy Lifestyle Recommendations  As a cancer survivor, it is important develop a lifelong commitment to a healthy lifestyle. A healthy lifestyle can prevent cancer from returning as well as prevent other diseases like heart disease, diabetes and high blood pressure.  These are some things that you can do to have a healthy lifestyle:  Marland Kitchen Maintain a healthy weight.  . Exercise daily per your doctor's orders. . Eat a balanced diet high in fruits, vegetables, bran, and fiber. Limit intake of red meat      and processed foods.  . Limit how much alcohol you consume, if at all. Ali Lowe regular bone mineral density testing for osteoporosis.  . Talk to your doctor about cardiovascular disease or "heart disease" screening. . Stop smoking (if you smoke). . Know your family history. . Be mindful of your emotional, social, and spiritual needs. . Meet regularly with a Primary Care Provider (PCP). Find a PCP if  you do not             already have one. . Talk to your doctor about regular cancer screening including screening for colon           cancer, GYN cancers, and skin cancer.

## 2017-03-07 ENCOUNTER — Inpatient Hospital Stay (HOSPITAL_COMMUNITY): Payer: Medicare Other

## 2017-03-07 ENCOUNTER — Encounter (HOSPITAL_COMMUNITY): Payer: Self-pay | Admitting: Emergency Medicine

## 2017-03-07 ENCOUNTER — Observation Stay (HOSPITAL_COMMUNITY)
Admission: EM | Admit: 2017-03-07 | Discharge: 2017-03-08 | Disposition: A | Discharge status: Home / Self Care | Payer: Medicare Other | Attending: Internal Medicine | Admitting: Internal Medicine

## 2017-03-07 DIAGNOSIS — G9340 Encephalopathy, unspecified: Secondary | ICD-10-CM | POA: Diagnosis not present

## 2017-03-07 DIAGNOSIS — W1839XA Other fall on same level, initial encounter: Secondary | ICD-10-CM | POA: Diagnosis not present

## 2017-03-07 DIAGNOSIS — Z7982 Long term (current) use of aspirin: Secondary | ICD-10-CM | POA: Insufficient documentation

## 2017-03-07 DIAGNOSIS — Z973 Presence of spectacles and contact lenses: Secondary | ICD-10-CM | POA: Insufficient documentation

## 2017-03-07 DIAGNOSIS — I10 Essential (primary) hypertension: Secondary | ICD-10-CM | POA: Diagnosis not present

## 2017-03-07 DIAGNOSIS — N179 Acute kidney failure, unspecified: Secondary | ICD-10-CM | POA: Diagnosis not present

## 2017-03-07 DIAGNOSIS — N390 Urinary tract infection, site not specified: Secondary | ICD-10-CM | POA: Diagnosis present

## 2017-03-07 DIAGNOSIS — Z79811 Long term (current) use of aromatase inhibitors: Secondary | ICD-10-CM | POA: Diagnosis not present

## 2017-03-07 DIAGNOSIS — E785 Hyperlipidemia, unspecified: Secondary | ICD-10-CM | POA: Insufficient documentation

## 2017-03-07 DIAGNOSIS — F209 Schizophrenia, unspecified: Secondary | ICD-10-CM | POA: Diagnosis not present

## 2017-03-07 DIAGNOSIS — E119 Type 2 diabetes mellitus without complications: Secondary | ICD-10-CM | POA: Diagnosis not present

## 2017-03-07 DIAGNOSIS — Z6839 Body mass index (BMI) 39.0-39.9, adult: Secondary | ICD-10-CM | POA: Insufficient documentation

## 2017-03-07 DIAGNOSIS — N3001 Acute cystitis with hematuria: Secondary | ICD-10-CM

## 2017-03-07 DIAGNOSIS — Z85828 Personal history of other malignant neoplasm of skin: Secondary | ICD-10-CM | POA: Insufficient documentation

## 2017-03-07 DIAGNOSIS — K219 Gastro-esophageal reflux disease without esophagitis: Secondary | ICD-10-CM | POA: Insufficient documentation

## 2017-03-07 DIAGNOSIS — C50411 Malignant neoplasm of upper-outer quadrant of right female breast: Secondary | ICD-10-CM | POA: Diagnosis not present

## 2017-03-07 DIAGNOSIS — Z79899 Other long term (current) drug therapy: Secondary | ICD-10-CM | POA: Insufficient documentation

## 2017-03-07 DIAGNOSIS — E669 Obesity, unspecified: Secondary | ICD-10-CM | POA: Diagnosis not present

## 2017-03-07 DIAGNOSIS — E860 Dehydration: Secondary | ICD-10-CM | POA: Insufficient documentation

## 2017-03-07 DIAGNOSIS — Z170 Estrogen receptor positive status [ER+]: Secondary | ICD-10-CM | POA: Diagnosis present

## 2017-03-07 DIAGNOSIS — E876 Hypokalemia: Secondary | ICD-10-CM | POA: Diagnosis not present

## 2017-03-07 HISTORY — DX: Acute kidney failure, unspecified: N17.9

## 2017-03-07 LAB — URINALYSIS, ROUTINE W REFLEX MICROSCOPIC
BILIRUBIN URINE: NEGATIVE
GLUCOSE, UA: NEGATIVE mg/dL
KETONES UR: NEGATIVE mg/dL
NITRITE: NEGATIVE
PH: 5 (ref 5.0–8.0)
PROTEIN: NEGATIVE mg/dL
Specific Gravity, Urine: 1.006 (ref 1.005–1.030)

## 2017-03-07 LAB — BASIC METABOLIC PANEL
Anion gap: 10 (ref 5–15)
BUN: 38 mg/dL — AB (ref 6–20)
CALCIUM: 9.1 mg/dL (ref 8.9–10.3)
CO2: 26 mmol/L (ref 22–32)
CREATININE: 1.78 mg/dL — AB (ref 0.44–1.00)
Chloride: 97 mmol/L — ABNORMAL LOW (ref 101–111)
GFR calc Af Amer: 30 mL/min — ABNORMAL LOW (ref >60)
GFR calc non Af Amer: 26 mL/min — ABNORMAL LOW (ref >60)
Glucose, Bld: 128 mg/dL — ABNORMAL HIGH (ref 65–99)
Potassium: 3.1 mmol/L — ABNORMAL LOW (ref 3.5–5.1)
SODIUM: 133 mmol/L — AB (ref 135–145)

## 2017-03-07 LAB — CREATININE, URINE, RANDOM: Creatinine, Urine: 70.24 mg/dL

## 2017-03-07 LAB — SODIUM, URINE, RANDOM: Sodium, Ur: <10 mmol/L

## 2017-03-07 LAB — CBC
HCT: 36.3 % (ref 36.0–46.0)
HEMOGLOBIN: 12 g/dL (ref 12.0–15.0)
MCH: 29.1 pg (ref 26.0–34.0)
MCHC: 33.1 g/dL (ref 30.0–36.0)
MCV: 87.9 fL (ref 78.0–100.0)
Platelets: 301 10*3/uL (ref 150–400)
RBC: 4.13 MIL/uL (ref 3.87–5.11)
RDW: 14.5 % (ref 11.5–15.5)
WBC: 13.2 10*3/uL — ABNORMAL HIGH (ref 4.0–10.5)

## 2017-03-07 LAB — MAGNESIUM: Magnesium: 2.2 mg/dL (ref 1.7–2.4)

## 2017-03-07 LAB — CK: Total CK: 718 U/L — ABNORMAL HIGH (ref 38–234)

## 2017-03-07 MED ORDER — BISACODYL 5 MG PO TBEC: 5.0000 mg | DELAYED_RELEASE_TABLET | Freq: Every day | ORAL | Status: DC | PRN

## 2017-03-07 MED ORDER — ONDANSETRON HCL 4 MG/2ML IJ SOLN: 4.0000 mg | Freq: Four times a day (QID) | INTRAMUSCULAR | Status: DC | PRN

## 2017-03-07 MED ORDER — ATORVASTATIN CALCIUM 40 MG PO TABS
40.0000 mg | ORAL_TABLET | Freq: Every day | ORAL | Status: DC
  Administered 2017-03-07: 40 mg via ORAL
  Filled 2017-03-07: qty 1

## 2017-03-07 MED ORDER — OMEGA-3-ACID ETHYL ESTERS 1 G PO CAPS
2.0000 g | ORAL_CAPSULE | Freq: Every day | ORAL | Status: DC
Start: 2017-03-08 — End: 2017-03-08
  Administered 2017-03-08: 2 g via ORAL
  Filled 2017-03-07 (×2): qty 2

## 2017-03-07 MED ORDER — DEXTROSE 5 % IV SOLN: 1.0000 g | INTRAVENOUS | Status: DC

## 2017-03-07 MED ORDER — DEXTROSE 5 % IV SOLN
1.0000 g | Freq: Once | INTRAVENOUS | Status: AC
  Administered 2017-03-07: 1 g via INTRAVENOUS
  Filled 2017-03-07: qty 10

## 2017-03-07 MED ORDER — POLYETHYLENE GLYCOL 3350 17 G PO PACK: 17.0000 g | PACK | Freq: Every day | ORAL | Status: DC | PRN

## 2017-03-07 MED ORDER — SODIUM CHLORIDE 0.9 % IV BOLUS (SEPSIS)
1000.0000 mL | Freq: Once | INTRAVENOUS | Status: AC
  Administered 2017-03-07: 1000 mL via INTRAVENOUS

## 2017-03-07 MED ORDER — HYDROCODONE-ACETAMINOPHEN 5-325 MG PO TABS: 1.0000 | ORAL_TABLET | ORAL | Status: DC | PRN

## 2017-03-07 MED ORDER — POTASSIUM CHLORIDE IN NACL 20-0.9 MEQ/L-% IV SOLN
INTRAVENOUS | Status: AC
  Administered 2017-03-07: 19:00:00 via INTRAVENOUS
  Filled 2017-03-07: qty 1000

## 2017-03-07 MED ORDER — ACETAMINOPHEN 325 MG PO TABS
650.0000 mg | ORAL_TABLET | Freq: Four times a day (QID) | ORAL | Status: DC | PRN
Start: 2017-03-07 — End: 2017-03-08

## 2017-03-07 MED ORDER — AMLODIPINE BESYLATE 5 MG PO TABS
5.0000 mg | ORAL_TABLET | Freq: Every day | ORAL | Status: DC
  Administered 2017-03-08: 10:00:00 5 mg via ORAL
  Filled 2017-03-07: qty 1

## 2017-03-07 MED ORDER — ONDANSETRON HCL 4 MG PO TABS: 4.0000 mg | ORAL_TABLET | Freq: Four times a day (QID) | ORAL | Status: DC | PRN

## 2017-03-07 MED ORDER — ACETAMINOPHEN 650 MG RE SUPP: 650.0000 mg | Freq: Four times a day (QID) | RECTAL | Status: DC | PRN

## 2017-03-07 MED ORDER — ANASTROZOLE 1 MG PO TABS
1.0000 mg | ORAL_TABLET | Freq: Every day | ORAL | Status: DC
  Administered 2017-03-08: 10:00:00 1 mg via ORAL
  Filled 2017-03-07: qty 1

## 2017-03-07 MED ORDER — ASPIRIN 81 MG PO CHEW
81.0000 mg | CHEWABLE_TABLET | Freq: Every day | ORAL | Status: DC
  Administered 2017-03-08: 81 mg via ORAL
  Filled 2017-03-07: qty 1

## 2017-03-07 MED ORDER — HEPARIN SODIUM (PORCINE) 5000 UNIT/ML IJ SOLN
5000.0000 [IU] | Freq: Three times a day (TID) | INTRAMUSCULAR | Status: DC
  Administered 2017-03-07 – 2017-03-08 (×3): 5000 [IU] via SUBCUTANEOUS
  Filled 2017-03-07 (×3): qty 1

## 2017-03-07 MED ORDER — POTASSIUM CHLORIDE CRYS ER 20 MEQ PO TBCR
40.0000 meq | EXTENDED_RELEASE_TABLET | Freq: Once | ORAL | Status: AC
  Administered 2017-03-07: 40 meq via ORAL
  Filled 2017-03-07: qty 2

## 2017-03-07 NOTE — ED Notes (Signed)
Coming from PCP's office-sending her here for possible dehydration-states urine dark, dizzy

## 2017-03-07 NOTE — H&P (Signed)
History and Physical    JULIEANNE HADSALL GQB:[id number] DOB: 1935/09/20 DOA: 03/07/2017  PCP: Leighton Ruff, MD   Patient coming from: Home, by way of PCP office   Chief Complaint: Urinary urgency/frequency, transient confusion, poor appetite   HPI: Lorraine Murphy is a 82 y.o. female with medical history significant for schizophrenia, hypertension, and history of breast cancer status post lumpectomy, now presenting to the emergency department at the direction of her PCP for evaluation of urinary urgency and frequency, transient confusion, and poor appetite for the past 3 days. Patient reports that she admitted her usual state of health until approximately 3 days ago when she noted the insidious development of urinary urgency and frequency, accompanied by urinary incontinence, and a nonspecific malaise. She also developed a poor appetite around that time and has not been eating or drinking much. She had developed some transient confusion the past 2 days, as well as lightheadedness, particularly upon standing. She went in to see her PCP for evaluation of this today, had a UA suggestive of infection, patient appeared significantly dehydrated, and she was directed to the emergency department for further evaluation and management of this. Patient denies any recent chest pain or palpitations, denies any dyspnea or cough, and denies any fevers or chills. She suffered a gentle fall secondary to lightheadedness couple days ago but did not suffer any significant injury and does not have any pain related to that.  ED Course: Upon arrival to the ED, patient is found to be afebrile, saturating well on room air, and with vital signs stable. Chemistry panels notable for sodium of 133, potassium 3.1, BUN of 38, and serum creatinine 1.78, up from 0.9 in 2015. CBC was notable for a leukocytosis to 13,200. Urinalysis suggests infection and features of microscopic blood. Patient was given a liter of normal saline, 40 mEq  oral potassium, and 1 g IV Rocephin in the ED. She remained hemodynamically stable and in no apparent respiratory distress and will be admitted to the medical/surgical unit for ongoing evaluation and management of poor appetite, transient confusion, and acute kidney injury secondary to acute urinary tract infection.  Review of Systems:  All other systems reviewed and apart from HPI, are negative.  Past Medical History:  Diagnosis Date  . AKI (acute kidney injury) ([place]) 03/07/2017  . Anxiety   . Arthritis   . Bilateral cataracts    no surgery  . 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 Cancer ([place])   . Depression   . Diabetes mellitus    diet contolled  . GERD (gastroesophageal reflux disease)   . H/O bone density study   . H/O colonoscopy 2012  . Hyperlipidemia   . Hypertension   . Incontinence   . Obesity   . Osteoarthritis   . Schizophrenia ([place]) 83 age   paranoid schzophrenia  . Skin cancer   . Wears glasses     Past Surgical History:  Procedure Laterality Date  . APPENDECTOMY    . BREAST SURGERY     right breast lumpectomy  . SPIDER VEIN INJECTION    . TONSILECTOMY, ADENOIDECTOMY, BILATERAL MYRINGOTOMY AND TUBES    . TUBAL LIGATION       reports that she has never smoked. She has never used smokeless tobacco. She reports that she drinks alcohol. She reports that she does not use drugs.  No Known Allergies  Family History  Problem Relation Age of Onset  .  Lung cancer Father      Prior to Admission medications   Medication Sig Start Date End Date Taking? Authorizing Provider  amLODipine (NORVASC) 5 MG tablet Take 5 mg by mouth daily.   Yes [provider]  anastrozole (ARIMIDEX) 1 MG tablet TAKE 1 TABLET (1 MG TOTAL) BY MOUTH DAILY. 12/07/15  Yes Magrinat, Virgie Dad, MD  aspirin 81 MG tablet Take 81 mg by mouth daily.   Yes [provider]  atorvastatin (LIPITOR) 80 MG tablet Take  40 mg by mouth daily.   Yes [provider]  fish oil-omega-3 fatty acids 1000 MG capsule Take 2 g by mouth daily.   Yes [provider]  Glycerin-Polysorbate 80 (REFRESH DRY EYE THERAPY OP) Apply to eye daily as needed.   Yes [provider]  haloperidol decanoate (HALDOL DECANOATE) 50 MG/ML injection every 28 (twenty-eight) days. monthly 02/06/12  Yes [provider]  losartan-hydrochlorothiazide Konrad Penta) 50-12.5 MG per tablet  01/05/12  Yes [provider]    Physical Exam: Vitals:   03/07/17 1429 03/07/17 1540 03/07/17 1708  BP: 134/68 (!) 123/51 (!) 129/51  Pulse: 72 68 66  Resp: 18 18 18   Temp: 98.3 F (36.8 C) 97.9 F (36.6 C)   TempSrc: Oral Oral   SpO2: 95% 94% 96%      Constitutional: NAD, calm Eyes: PERTLA, lids and conjunctivae normal ENMT: Mucous membranes are dry, tacky. Posterior pharynx clear of any exudate or lesions.   Neck: normal, supple, no masses, no thyromegaly Respiratory: clear to auscultation bilaterally, no wheezing, no crackles. Normal respiratory effort.    Cardiovascular: S1 & S2 heard, regular rate and rhythm. No extremity edema. 2+ pedal pulses. No significant JVD. Abdomen: No distension, no tenderness, no masses palpated. Bowel sounds normal.  Musculoskeletal: no clubbing / cyanosis. No joint deformity upper and lower extremities.   Skin: no significant rashes, lesions, ulcers. Warm, dry, well-perfused. Poor turgor.  Neurologic: CN 2-12 grossly intact. Sensation intact, DTR normal. Strength 5/5 in all 4 limbs.  Psychiatric: Alert and oriented x 3. Pleasant, cooperative.    Labs on Admission: I have personally reviewed following labs and imaging studies  CBC:  Recent Labs Lab 03/07/17 1454  WBC 13.2*  HGB 12.0  HCT 36.3  MCV 87.9  PLT 932   Basic Metabolic Panel:  Recent Labs Lab 03/07/17 1454  NA 133*  K 3.1*  CL 97*  CO2 26  GLUCOSE 128*  BUN 38*  CREATININE 1.78*  CALCIUM 9.1  MG  2.2   GFR: CrCl cannot be calculated (Unknown ideal weight.). Liver Function Tests: No results for input(s): AST, ALT, ALKPHOS, BILITOT, PROT, ALBUMIN in the last 168 hours. No results for input(s): LIPASE, AMYLASE in the last 168 hours. No results for input(s): AMMONIA in the last 168 hours. Coagulation Profile: No results for input(s): INR, PROTIME in the last 168 hours. Cardiac Enzymes:  Recent Labs Lab 03/07/17 1454  CKTOTAL 718*   BNP (last 3 results) No results for input(s): PROBNP in the last 8760 hours. HbA1C: No results for input(s): HGBA1C in the last 72 hours. CBG: No results for input(s): GLUCAP in the last 168 hours. Lipid Profile: No results for input(s): CHOL, HDL, LDLCALC, TRIG, CHOLHDL, LDLDIRECT in the last 72 hours. Thyroid Function Tests: No results for input(s): TSH, T4TOTAL, FREET4, T3FREE, THYROIDAB in the last 72 hours. Anemia Panel: No results for input(s): VITAMINB12, FOLATE, FERRITIN, TIBC, IRON, RETICCTPCT in the last 72 hours. Urine analysis:  Component Value Date/Time   COLORURINE YELLOW 03/07/2017 1430   APPEARANCEUR HAZY (A) 03/07/2017 1430   LABSPEC 1.006 03/07/2017 1430   PHURINE 5.0 03/07/2017 1430   GLUCOSEU NEGATIVE 03/07/2017 1430   HGBUR MODERATE (A) 03/07/2017 1430   BILIRUBINUR NEGATIVE 03/07/2017 1430   KETONESUR NEGATIVE 03/07/2017 1430   PROTEINUR NEGATIVE 03/07/2017 1430   NITRITE NEGATIVE 03/07/2017 1430   LEUKOCYTESUR LARGE (A) 03/07/2017 1430   Sepsis Labs: @LABRCNTIP (procalcitonin:4,lacticidven:4) )No results found for this or any previous visit (from the past 240 hour(s)).   Radiological Exams on Admission: No results found.  EKG: Not performed.   Assessment/Plan  1. UTI  - Pt presents with 3-4 days of urinary sxs, transient confusion, anorexia - She is found to be afebrile, but with leukocytosis and UA highly-suggestive of infection - She was treated with empiric Rocephin in ED - No prior urine cultures on  file  - Urine culture is incubating  - Plan to continue empiric treatment with Rocephin pending culture data; pt will require admission d/t the associated confusion and AKI     2. Acute kidney injury  - SCr is 1.78 on admission, up from 0.9 in 2015 with no more recent values available  - This is likely an acute prerenal azotemia given the acute infection with a few days of anorexia  - She was given a liter of NS in ED  - Plan to continue IVF hydration, hold losartan-HCTZ, check urine studies and renal US, and repeat chem panel in am    3. Hypokalemia  - Serum potassium is 3.1 on admission, likely secondary to poor nutrition vs HCTZ  - She was treated with 40 mEq oral potassium in ED  - Magnesium level wnl  - Repeat chem panel in am    4. Hypertension  - BP is at goal  - Continue Norvasc, hold losartan-HCTZ as above in setting of suspected AKI   5. Hx of breast cancer  - Follows with Dr. Griffith Citron for ongoing surveillance  - Continue Arimidex   6. Schizophrenia  - Appears stable, pt denies hallucinations, SI, or HI - She is managed with Haldol decanoate shot monthly, last dose 03/03/17    DVT prophylaxis: sq heparin Code Status: Full  Family Communication: Discussed with patient Disposition Plan: Admit to med-surg Consults called: None Admission status: Inpatient    Vianne Bulls, MD [facility] Pager [phone]  If 7PM-7AM, please contact night-coverage [email or web address] Password [facility]  03/07/2017, 7:29 PM

## 2017-03-07 NOTE — ED Provider Notes (Signed)
[facility] DEPT Provider Note   CSN: [id number] Arrival date & time: 03/07/17  1350     History   Chief Complaint Chief Complaint  Patient presents with  . Urinary Tract Infection    HPI Lorraine Murphy is a 82 y.o. female.  The history is provided by the patient, medical records and a relative (Son). No language interpreter was used.   Lorraine Murphy is a 82 y.o. female  with a PMH of schizophrenia, DM, prior breast cancer currently in remission, HTN, HLD who presents to the Emergency Department from PCP for concerns of UTI and dehydration. Patient states that over the weekend she has noticed her urine with foul odor and she has been needing to use depends as she is dribbling urine. + hesitancy. No dysuria. She also notes that she has been somewhat confused over the weekend. For example, on Saturday, she went to have dinner at her sons home, but when driving, could not remember which home was his and passed by it several times before realizing which house was correct. Sunday, she notes that she was walking around her house when she got dizzy and fell forward onto her elbows. No head injury. She states it was not a very bad fall, however she was unable to get up off the floor foe quite some time. She states that it was about two hours when she was able to scoot to the phone and call for help. Seen by PCP today where POC UA showed large leuks. PCP felt she was very dehydrated and sent patient to ER for further evaluation. No fevers, chills, back pain, chest pain or shortness of breath. No meds taken prior to arrival for symptoms.    Past Medical History:  Diagnosis Date  . AKI (acute kidney injury) ([place]) 03/07/2017  . Anxiety   . Arthritis   . Bilateral cataracts    no surgery  . Breast cancer ([place]) 02/29/12   right breast lumpectomy=invasive ductal cs ,ER?PR=positive her2 neu=neg  . Cancer ([place])   . Depression   . Diabetes mellitus    diet contolled  . GERD (gastroesophageal reflux  disease)   . H/O bone density study   . H/O colonoscopy 2012  . Hyperlipidemia   . Hypertension   . Incontinence   . Obesity   . Osteoarthritis   . Schizophrenia ([place]) 13 age   paranoid schzophrenia  . Skin cancer   . Wears glasses     Patient Active Problem List   Diagnosis Date Noted  . Schizophrenia ([place]) 03/07/2017  . Acute lower UTI 03/07/2017  . AKI (acute kidney injury) ([place]) 03/07/2017  . Acute encephalopathy 03/07/2017  . Hypokalemia 03/07/2017  . Essential hypertension 03/07/2017  . UTI (urinary tract infection) 03/07/2017  . Postmenopausal estrogen deficiency 03/26/2014  . Hypercalcemia 08/20/2013  . Skin cancer   . Anxiety   . Breast cancer ([place]) 02/29/2012  . Primary cancer of upper outer quadrant of right female breast ([place]) 02/16/2012    Past Surgical History:  Procedure Laterality Date  . APPENDECTOMY    . BREAST SURGERY     right breast lumpectomy  . SPIDER VEIN INJECTION    . TONSILECTOMY, ADENOIDECTOMY, BILATERAL MYRINGOTOMY AND TUBES    . TUBAL LIGATION      OB History    Gravida Para Term Preterm AB Living   2 2           SAB TAB Ectopic Multiple Live Births  Obstetric Comments   Menarche age 79, HRT 10-15 years,       Home Medications    Prior to Admission medications   Medication Sig Start Date End Date Taking? Authorizing Provider  amLODipine (NORVASC) 5 MG tablet Take 5 mg by mouth daily.   Yes [provider]  anastrozole (ARIMIDEX) 1 MG tablet TAKE 1 TABLET (1 MG TOTAL) BY MOUTH DAILY. 12/07/15  Yes Magrinat, Virgie Dad, MD  aspirin 81 MG tablet Take 81 mg by mouth daily.   Yes [provider]  atorvastatin (LIPITOR) 80 MG tablet Take 40 mg by mouth daily.   Yes [provider]  fish oil-omega-3 fatty acids 1000 MG capsule Take 2 g by mouth daily.   Yes [provider]  Glycerin-Polysorbate 80 (REFRESH DRY EYE THERAPY OP) Apply to eye daily as needed.   Yes [provider]   haloperidol decanoate (HALDOL DECANOATE) 50 MG/ML injection every 28 (twenty-eight) days. monthly 02/06/12  Yes [provider]  losartan-hydrochlorothiazide Konrad Penta) 50-12.5 MG per tablet  01/05/12  Yes [provider]    Family History Family History  Problem Relation Age of Onset  . Lung cancer Father     Social History Social History  Substance Use Topics  . Smoking status: Never Smoker  . Smokeless tobacco: Never Used  . Alcohol use Yes     Comment: socially      Allergies   Patient has no known allergies.   Review of Systems Review of Systems  Genitourinary:       + foul odor to urine, + hesitancy  Psychiatric/Behavioral: Positive for confusion.  All other systems reviewed and are negative.    Physical Exam Updated Vital Signs BP (!) 129/51   Pulse 66   Temp 97.9 F (36.6 C) (Oral)   Resp 18   SpO2 96%   Physical Exam  Constitutional: She is oriented to person, place, and time. She appears well-developed and well-nourished. No distress.  HENT:  Head: Normocephalic and atraumatic.  Cardiovascular: Normal rate, regular rhythm and normal heart sounds.   No murmur heard. Pulmonary/Chest: Effort normal and breath sounds normal. No respiratory distress.  Abdominal: Soft. Bowel sounds are normal. She exhibits no distension.  No abdominal or CVA tenderness.  Musculoskeletal:  No midline C/T/L spine tenderness. 5/5 muscle strength x 4 including grip strength. Bilateral elbows with scabbed over abrasions - no tenderness to palpation and full ROM without pain.  Neurological: She is alert and oriented to person, place, and time.  Speech clear and goal oriented. CN 2-12 grossly intact. Normal finger-to-nose and rapid alternating movements. No drift. Strength and sensation intact.  Skin: Skin is warm and dry.  Nursing note and vitals reviewed.    ED Treatments / Results  Labs (all labs ordered are listed, but only abnormal results are  displayed) Labs Reviewed  URINALYSIS, ROUTINE W REFLEX MICROSCOPIC - Abnormal; Notable for the following:       Result Value   APPearance HAZY (*)    Hgb urine dipstick MODERATE (*)    Leukocytes, UA LARGE (*)    Bacteria, UA MANY (*)    Squamous Epithelial / LPF 0-5 (*)    All other components within normal limits  CBC - Abnormal; Notable for the following:    WBC 13.2 (*)    All other components within normal limits  BASIC METABOLIC PANEL - Abnormal; Notable for the following:    Sodium 133 (*)    Potassium 3.1 (*)  Chloride 97 (*)    Glucose, Bld 128 (*)    BUN 38 (*)    Creatinine, Ser 1.78 (*)    GFR calc non Af Amer 26 (*)    GFR calc Af Amer 30 (*)    All other components within normal limits  CK - Abnormal; Notable for the following:    Total CK 718 (*)    All other components within normal limits  URINE CULTURE  MAGNESIUM  CREATININE, URINE, RANDOM  SODIUM, URINE, RANDOM  UREA NITROGEN, URINE    EKG  EKG Interpretation None       Radiology No results found.  Procedures Procedures (including critical care time)  Medications Ordered in ED Medications  cefTRIAXone (ROCEPHIN) 1 g in dextrose 5 % 50 mL IVPB (not administered)  0.9 % NaCl with KCl 20 mEq/ L  infusion (not administered)  sodium chloride 0.9 % bolus 1,000 mL (0 mLs Intravenous Stopped 03/07/17 1859)  potassium chloride SA (K-DUR,KLOR-CON) CR tablet 40 mEq (40 mEq Oral Given 03/07/17 1654)     Initial Impression / Assessment and Plan / ED Course  I have reviewed the triage vital signs and the nursing notes.  Pertinent labs & imaging results that were available during my care of the patient were reviewed by me and considered in my medical decision making (see chart for details).    Lorraine Murphy is a 82 y.o. female who presents to ED for confusion and foul smelling odor to urine. Seen by PCP earlier today who was concerned for UTI and dehydration and felt like patient needed more thorough  evaluation in the ER. Patient did also note having a fall on Sunday (2 days ago) - it does not appear that she has any injuries from fall other than abrasions to the elbows, however she was down on the ground for several hours. CK elevated at 718. IV fluids running. Abs reviewed and notable for mild leukocytosis of 13.2. Hypokalemia at 3.1 - replenished. [place] wdl. New AKI with creatinine 1.78. UA with TNTC white cells and large leuks. Patient given a dose of Rocephin in ED. Hospitalist consulted who will admit.   Patient seen by and discussed with Dr. Laverta Baltimore who agrees with treatment plan.   Final Clinical Impressions(s) / ED Diagnoses   Final diagnoses:  AKI (acute kidney injury) [facility])    New Prescriptions New Prescriptions   No medications on file     Jarron Curley, Ozella Almond, Hershal Coria 03/07/17 Judee Clara, MD 03/07/17 [phone]

## 2017-03-07 NOTE — ED Triage Notes (Signed)
Per pt, states her PO intake has been erratic for about 3 days-states she went and saw PCP today and had trouble giving UA sample-states frequency and odor-sent here foe possible dehydration

## 2017-03-08 DIAGNOSIS — E876 Hypokalemia: Secondary | ICD-10-CM | POA: Diagnosis not present

## 2017-03-08 DIAGNOSIS — N390 Urinary tract infection, site not specified: Secondary | ICD-10-CM | POA: Diagnosis not present

## 2017-03-08 DIAGNOSIS — N179 Acute kidney failure, unspecified: Secondary | ICD-10-CM | POA: Diagnosis not present

## 2017-03-08 LAB — BASIC METABOLIC PANEL
ANION GAP: 9 (ref 5–15)
BUN: 30 mg/dL — ABNORMAL HIGH (ref 6–20)
CALCIUM: 8.9 mg/dL (ref 8.9–10.3)
CO2: 26 mmol/L (ref 22–32)
Chloride: 102 mmol/L (ref 101–111)
Creatinine, Ser: 1.22 mg/dL — ABNORMAL HIGH (ref 0.44–1.00)
GFR, EST AFRICAN AMERICAN: 48 mL/min — AB (ref >60)
GFR, EST NON AFRICAN AMERICAN: 41 mL/min — AB (ref >60)
GLUCOSE: 156 mg/dL — AB (ref 65–99)
POTASSIUM: 3.3 mmol/L — AB (ref 3.5–5.1)
Sodium: 137 mmol/L (ref 135–145)

## 2017-03-08 LAB — CBC WITH DIFFERENTIAL/PLATELET
BASOS ABS: 0 10*3/uL (ref 0.0–0.1)
BASOS PCT: 0 %
EOS PCT: 0 %
Eosinophils Absolute: 0 10*3/uL (ref 0.0–0.7)
HCT: 36.4 % (ref 36.0–46.0)
Hemoglobin: 11.9 g/dL — ABNORMAL LOW (ref 12.0–15.0)
LYMPHS PCT: 15 %
Lymphs Abs: 1.5 10*3/uL (ref 0.7–4.0)
MCH: 28.8 pg (ref 26.0–34.0)
MCHC: 32.7 g/dL (ref 30.0–36.0)
MCV: 88.1 fL (ref 78.0–100.0)
MONO ABS: 1.1 10*3/uL — AB (ref 0.1–1.0)
Monocytes Relative: 11 %
Neutro Abs: 7.5 10*3/uL (ref 1.7–7.7)
Neutrophils Relative %: 74 %
PLATELETS: 290 10*3/uL (ref 150–400)
RBC: 4.13 MIL/uL (ref 3.87–5.11)
RDW: 14.7 % (ref 11.5–15.5)
WBC: 10.2 10*3/uL (ref 4.0–10.5)

## 2017-03-08 LAB — UREA NITROGEN, URINE: Urea Nitrogen, Ur: 382 mg/dL

## 2017-03-08 LAB — GLUCOSE, CAPILLARY: Glucose-Capillary: 159 mg/dL — ABNORMAL HIGH (ref 65–99)

## 2017-03-08 MED ORDER — DEXTROSE 5 % IV SOLN
2.0000 g | INTRAVENOUS | Status: DC
  Administered 2017-03-08: 2 g via INTRAVENOUS
  Filled 2017-03-08 (×2): qty 2

## 2017-03-08 MED ORDER — POTASSIUM CHLORIDE CRYS ER 20 MEQ PO TBCR
40.0000 meq | EXTENDED_RELEASE_TABLET | Freq: Two times a day (BID) | ORAL | Status: DC
  Administered 2017-03-08: 40 meq via ORAL
  Filled 2017-03-08: qty 2

## 2017-03-08 MED ORDER — LIP MEDEX EX OINT
TOPICAL_OINTMENT | CUTANEOUS | Status: DC | PRN
  Administered 2017-03-08: 06:00:00 via TOPICAL
  Filled 2017-03-08: qty 7

## 2017-03-08 MED ORDER — CEPHALEXIN 500 MG PO CAPS: 500.0000 mg | ORAL_CAPSULE | Freq: Two times a day (BID) | ORAL | 0 refills | Status: AC

## 2017-03-08 NOTE — Progress Notes (Signed)
ABN signed, patient wishes to d/c home. [phone]

## 2017-03-08 NOTE — Discharge Summary (Signed)
Physician Discharge Summary  Lorraine Murphy OFB:[id number] DOB: [birth year] DOA: 03/07/2017  PCP: Leighton Ruff, MD  Admit date: 03/07/2017 Discharge date: 03/08/2017  Admitted From: Home Disposition:  Home  Recommendations for Outpatient Follow-up:  1. Follow up with PCP in 1-2 weeks 2. Please obtain BMP/CBC in one week 3. Please follow up with Pcp regarding repeat US kidney for the evaluation of simple cyst.      Discharge Condition:stable.  CODE STATUS: full code.  Diet recommendation: Heart Healthy   Brief/Interim Summary: Lorraine Murphy is a 82 y.o. female with medical history significant for schizophrenia, hypertension, and history of breast cancer status post lumpectomy, now presenting to the emergency department at the direction of her PCP for evaluation of urinary urgency and frequency, transient confusion, and poor appetite for the past 3 days.  Discharge Diagnoses:  Principal Problem:   Acute lower UTI Active Problems:   Primary cancer of upper outer quadrant of right female breast ([place])   Schizophrenia ([place])   AKI (acute kidney injury) ([place])   Acute encephalopathy   Hypokalemia   Essential hypertension   UTI (urinary tract infection)  Urinary tract infection:  Her symptoms have improved. Urine cultures sent and pending and neg so far. She was started on IV rocephin and discharged on oral keflex to complete the course.    Acute kidney injury: On admission creatinine is 1.78, improved to 1.22 with hydration. Stopped losartan hctz on discharge. Recommend checking BMP in on e week at PCP office.    Hypokalemia: replaced.   Hx breast cancer:  Follow up with Dr Griffith Citron as recommended as outpatient.    Schizophrenia:  Stable.    Discharge Instructions  Discharge Instructions    Diet - low sodium heart healthy    Complete by:  As directed    Increase activity slowly    Complete by:  As directed      Allergies as of 03/08/2017   No Known Allergies      Medication List    STOP taking these medications   losartan-hydrochlorothiazide 50-12.5 MG tablet Commonly known as:  HYZAAR     TAKE these medications   amLODipine 5 MG tablet Commonly known as:  NORVASC Take 5 mg by mouth daily.   anastrozole 1 MG tablet Commonly known as:  ARIMIDEX TAKE 1 TABLET (1 MG TOTAL) BY MOUTH DAILY.   aspirin 81 MG tablet Take 81 mg by mouth daily.   atorvastatin 80 MG tablet Commonly known as:  LIPITOR Take 40 mg by mouth daily.   cephALEXin 500 MG capsule Commonly known as:  KEFLEX Take 1 capsule (500 mg total) by mouth 2 (two) times daily.   fish oil-omega-3 fatty acids 1000 MG capsule Take 2 g by mouth daily.   haloperidol decanoate 50 MG/ML injection Commonly known as:  HALDOL DECANOATE every 28 (twenty-eight) days. monthly   REFRESH DRY EYE THERAPY OP Apply to eye daily as needed.      Follow-up Information    Leighton Ruff, MD. Schedule an appointment as soon as possible for a visit in 1 week(s).   Specialty:  Family Medicine Why:  one week, post hospitalization visit.  Contact information: [place] [id number] [phone]          No Known Allergies  Consultations:  No new complaints.    Procedures/Studies: US Renal  Result Date: 03/07/2017 CLINICAL DATA:  Acute renal injury. EXAM: RENAL / URINARY TRACT ULTRASOUND COMPLETE COMPARISON:  04/17/2009 renal  ultrasound FINDINGS: Right Kidney: Length: 13.1 cm. Echogenicity within normal limits. Anechoic simple cyst off the lower pole has grown larger in the interval currently estimated at 4.5 x 4.1 x 4.2 cm versus 1.9 x 1.8 x 2 cm previously. No solid mass or hydronephrosis visualized. Left Kidney: Length: 12.9 cm. Echogenicity within normal limits. No mass or hydronephrosis visualized. Bladder: Appears normal for degree of bladder distention. IMPRESSION: 1. There is maintained cortical-medullary distinction within both kidneys without obstructive  uropathy. 2. There has been interval enlargement of a simple cyst involving the right lower pole currently estimated at 4.5 x 4 x 1 x 4.2 cm versus 1.9 x 1.8 x 2 cm previously. Electronically Signed   By: Ashley Royalty M.D.   On: 03/07/2017 20:02       Subjective: No new complaints. Wants to go home. Ordered home health for safety evaluation.   Discharge Exam: Vitals:   03/08/17 0504 03/08/17 1413  BP: (!) 129/58 (!) 122/55  Pulse: 78 67  Resp: 15 16  Temp: 99.8 F (37.7 C) 98 F (36.7 C)   Vitals:   03/07/17 2049 03/07/17 2052 03/08/17 0504 03/08/17 1413  BP: (!) 120/49  (!) 129/58 (!) 122/55  Pulse: 70  78 67  Resp: 16  15 16   Temp: 98.4 F (36.9 C)  99.8 F (37.7 C) 98 F (36.7 C)  TempSrc: Oral  Oral Oral  SpO2: 97%  94% 94%  Weight:  107.4 kg (236 lb 12.4 oz)    Height:  5\' 5"  (1.651 m)      General: Pt is alert, awake, not in acute distress Cardiovascular: RRR, S1/S2 +, no rubs, no gallops Respiratory: CTA bilaterally, no wheezing, no rhonchi Abdominal: Soft, NT, ND, bowel sounds + Extremities: no edema, no cyanosis    The results of significant diagnostics from this hospitalization (including imaging, microbiology, ancillary and laboratory) are listed below for reference.     Microbiology: No results found for this or any previous visit (from the past 240 hour(s)).   Labs: BNP (last 3 results) No results for input(s): BNP in the last 8760 hours. Basic Metabolic Panel:  Recent Labs Lab 03/07/17 1454 03/08/17 0745  NA 133* 137  K 3.1* 3.3*  CL 97* 102  CO2 26 26  GLUCOSE 128* 156*  BUN 38* 30*  CREATININE 1.78* 1.22*  CALCIUM 9.1 8.9  MG 2.2  --    Liver Function Tests: No results for input(s): AST, ALT, ALKPHOS, BILITOT, PROT, ALBUMIN in the last 168 hours. No results for input(s): LIPASE, AMYLASE in the last 168 hours. No results for input(s): AMMONIA in the last 168 hours. CBC:  Recent Labs Lab 03/07/17 1454 03/08/17 0745  WBC 13.2*  10.2  NEUTROABS  --  7.5  HGB 12.0 11.9*  HCT 36.3 36.4  MCV 87.9 88.1  PLT 301 290   Cardiac Enzymes:  Recent Labs Lab 03/07/17 1454  CKTOTAL 718*   BNP: Invalid input(s): POCBNP CBG:  Recent Labs Lab 03/08/17 0749  GLUCAP 159*   D-Dimer No results for input(s): DDIMER in the last 72 hours. Hgb A1c No results for input(s): HGBA1C in the last 72 hours. Lipid Profile No results for input(s): CHOL, HDL, LDLCALC, TRIG, CHOLHDL, LDLDIRECT in the last 72 hours. Thyroid function studies No results for input(s): TSH, T4TOTAL, T3FREE, THYROIDAB in the last 72 hours.  Invalid input(s): FREET3 Anemia work up No results for input(s): VITAMINB12, FOLATE, FERRITIN, TIBC, IRON, RETICCTPCT in the last 72 hours. Urinalysis  Component Value Date/Time   COLORURINE YELLOW 03/07/2017 1430   APPEARANCEUR HAZY (A) 03/07/2017 1430   LABSPEC 1.006 03/07/2017 1430   PHURINE 5.0 03/07/2017 1430   GLUCOSEU NEGATIVE 03/07/2017 1430   HGBUR MODERATE (A) 03/07/2017 1430   BILIRUBINUR NEGATIVE 03/07/2017 1430   KETONESUR NEGATIVE 03/07/2017 1430   PROTEINUR NEGATIVE 03/07/2017 1430   NITRITE NEGATIVE 03/07/2017 1430   LEUKOCYTESUR LARGE (A) 03/07/2017 1430   Sepsis Labs Invalid input(s): PROCALCITONIN,  WBC,  LACTICIDVEN Microbiology No results found for this or any previous visit (from the past 240 hour(s)).   Time coordinating discharge: Over 30 minutes  SIGNED:   Hosie Poisson, MD  [facility] 03/08/2017, 5:10 PM Pager   If 7PM-7AM, please contact night-coverage [email or web address] Password [email or web address]
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
## 2017-03-08 NOTE — Care Management Obs Status (Signed)
[facility] NOTIFICATION   Patient Details  Name: Lorraine Murphy MRN: [id number] Date of Birth: April 13, 1935   Medicare Observation Status Notification Given:       Guadalupe Maple, RN 03/08/2017, 5:01 PM

## 2017-03-09 NOTE — Progress Notes (Signed)
Late Entry: spoke with patient later in the day 6/7. She stated she would be d/ced, she was agreeable to [facility] services. Contacted [facility] for referral, they accepted patient after hours and will process referral in am. No DME needs identified. Patient happy with plan.

## 2017-03-10 LAB — URINE CULTURE: Culture: >=100000 — AB

## 2017-03-14 ENCOUNTER — Ambulatory Visit (HOSPITAL_BASED_OUTPATIENT_CLINIC_OR_DEPARTMENT_OTHER): Payer: Medicare Other | Admitting: Oncology

## 2017-03-14 VITALS — BP 143/65 | HR 75 | Temp 98.0°F | Resp 17 | Ht 65.0 in | Wt 237.4 lb

## 2017-03-14 DIAGNOSIS — Z170 Estrogen receptor positive status [ER+]: Principal | ICD-10-CM

## 2017-03-14 DIAGNOSIS — C50411 Malignant neoplasm of upper-outer quadrant of right female breast: Secondary | ICD-10-CM

## 2017-03-14 DIAGNOSIS — Z853 Personal history of malignant neoplasm of breast: Secondary | ICD-10-CM | POA: Diagnosis not present

## 2017-03-14 NOTE — Progress Notes (Signed)
ID: ZONA PEDRO   DOB: 1935/08/13  MR#: [id number]  IHW#:[id number]  PCP: Leighton Ruff, MD GYN: SURolm Bookbinder, MD OTHER MD: Gery Pray, MD;  Ronald Lobo, MD   CHIEF COMPLAINT:  Hx of Right Breast Cancer   CURRENT TREATMENT: Completing 5 years of Anastrozole   HISTORY OF PRESENT ILLNESS:  From the original intake note:  Lorraine Murphy (pronoiunced "Goin") palpated a mass in her right breast about a week before routine mammography was due 02/07/2012. She mentioned this at [facility] so her screening mammography was changed to diagnostic, and this showed a small well circumscribed mass in the right breast, without associated microcalcifications. This was palpable and mobile by exam.   Ultrasound showed a hypoechoic lobulated mass measuring 1.0 cm. Core biopsy was performed May 9 and showed ([id number]) an invasive ductal carcinoma, grade 1, with a prognostic panel pending. Bilateral breast MRI was performed 02/17/2012 and confirmed an enhancing irregular mass measuring 1.3 cm in the right breast, with a 0.6 cm mass medial to that. There was no enlarged axillary or internal mammary adenopathy.   The patient's subsequent history is as detailed below.  INTERVAL HISTORY: Lorraine Murphy returns today for follow-up of her estrogen receptor positive breast cancer accompanied by a friend. She continues on anastrozole generally with good tolerance. Hot flashes and vaginal dryness are not a major issue. She never developed the arthralgias or myalgias that many patients can experience on this medication. She obtains it at a good price.  Lorraine Murphy was recently discharged from the hospital where she was admitted with confusion secondary to a urinary tract infection.  REVIEW of SYSTEMS: She has high fall risk and uses a walker. She has not fallen recently however. She denies pain, unusual headaches, sudden visual changes, nausea, or vomiting. She denies cough phlegm production or pleurisy. A detailed review of  systems today was otherwise stable   PAST MEDICAL HISTORY: Past Medical History:  Diagnosis Date  . AKI (acute kidney injury) ([place]) 03/07/2017  . Anxiety   . Arthritis   . Bilateral cataracts    no surgery  . Breast cancer ([place]) 02/29/12   right breast lumpectomy=invasive ductal cs ,ER?PR=positive her2 neu=neg  . Cancer ([place])   . Depression   . Diabetes mellitus    diet contolled  . GERD (gastroesophageal reflux disease)   . H/O bone density study   . H/O colonoscopy 2012  . Hyperlipidemia   . Hypertension   . Incontinence   . Obesity   . Osteoarthritis   . Schizophrenia ([place]) 82 age   paranoid schzophrenia  . Skin cancer   . Wears glasses     PAST SURGICAL HISTORY: Past Surgical History:  Procedure Laterality Date  . APPENDECTOMY    . BREAST SURGERY     right breast lumpectomy  . SPIDER VEIN INJECTION    . TONSILECTOMY, ADENOIDECTOMY, BILATERAL MYRINGOTOMY AND TUBES    . TUBAL LIGATION      FAMILY HISTORY Family History  Problem Relation Age of Onset  . Lung cancer Father    the patient's father died at the age of 19, shortly after being diagnosed with lung cancer, in the setting of tobacco abuse. The patient's mother died at the age of 49, possibly from heart disease. The patient had 2 brothers. One of them died in infancy. Of the other has a history of skin cancers, not melanoma. The patient had one sister who died from complications of emphysema. There is no history of breast or ovarian  cancer in the family.  GYNECOLOGIC HISTORY:  (Reviewed 03/26/2014)  Menarche age 52, first live birth age 23, she is GX P2, went through menopause approximately age 35, and used hormone replacement for 10-15 years, stopping several years ago.   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 She is divorced and lives at home with her beagle "[name]." Her son Lorraine Murphy is a Engineer, maintenance (IT)  and lives in [place]. He has 2 daughters. Son Lorraine Murphy is a Sports coach in [place]. Lorraine Murphy also has a daughter, 48 months old as of June 2015.  The patient is a [employer].    ADVANCED DIRECTIVES: No advanced directives in place. Though she does have mental health advanced directives.   HEALTH MAINTENANCE: (Updated 03/26/2014) Social History  Substance Use Topics  . Smoking status: Never Smoker  . Smokeless tobacco: Never Used  . Alcohol use Yes     Comment: socially      Colonoscopy: 2011/[facility]  PAP: Not on file  Bone density: October [facility] at [facility], normal  Lipid panel:UTD, Dr. Drema Dallas   No Known Allergies  Current Outpatient Prescriptions  Medication Sig Dispense Refill  . amLODipine (NORVASC) 5 MG tablet Take 5 mg by mouth daily.    Marland Kitchen anastrozole (ARIMIDEX) 1 MG tablet TAKE 1 TABLET (1 MG TOTAL) BY MOUTH DAILY. 90 tablet 3  . aspirin 81 MG tablet Take 81 mg by mouth daily.    Marland Kitchen atorvastatin (LIPITOR) 80 MG tablet Take 40 mg by mouth daily.    . cephALEXin (KEFLEX) 500 MG capsule Take 1 capsule (500 mg total) by mouth 2 (two) times daily. 15 capsule 0  . fish oil-omega-3 fatty acids 1000 MG capsule Take 2 g by mouth daily.    . Glycerin-Polysorbate 80 (REFRESH DRY EYE THERAPY OP) Apply to eye daily as needed.    . haloperidol decanoate (HALDOL DECANOATE) 50 MG/ML injection every 28 (twenty-eight) days. monthly     No current facility-administered medications for this visit.     OBJECTIVE: Elderly white woman who Walks with a walker Vitals:   03/14/17 1404  BP: (!) 143/65  Pulse: 75  Resp: 17  Temp: 98 F (36.7 C)     Body mass index is 39.51 kg/m.    ECOG FS: 2 Filed Weights   03/14/17 1404  Weight: 237 lb 6.4 oz (107.7 kg)   Sclerae unicteric, pupils round and equal Oropharynx clear and moist No cervical or supraclavicular adenopathy Lungs no rales or rhonchi Heart regular rate and rhythm Abd soft, obese, nontender, positive bowel  sounds MSK kyphosis but no focal spinal tenderness, no upper extremity lymphedema Neuro: nonfocal, well oriented, appropriate affect Breasts: The right breast is status post lumpectomy with no evidence of local recurrence. The left breast is unremarkable. Both axillae are benign.   LAB RESULTS: Lab Results  Component Value Date   WBC 10.2 03/08/2017   NEUTROABS 7.5 03/08/2017   HGB 11.9 (L) 03/08/2017   HCT 36.4 03/08/2017   MCV 88.1 03/08/2017   PLT 290 03/08/2017      Chemistry      Component Value Date/Time   NA 137 03/08/2017 0745   NA 143 03/30/2015 1110   K 3.3 (L) 03/08/2017 0745   K 4.1 03/30/2015 1110   CL 102 03/08/2017 0745   CL 104 02/05/2013 1249   CO2 26 03/08/2017 0745   CO2 28 03/30/2015  1110   BUN 30 (H) 03/08/2017 0745   BUN 22.9 03/30/2015 1110   CREATININE 1.22 (H) 03/08/2017 0745   CREATININE 0.9 03/30/2015 1110      Component Value Date/Time   CALCIUM 8.9 03/08/2017 0745   CALCIUM 10.3 03/30/2015 1110   ALKPHOS 85 03/30/2015 1110   AST 19 03/30/2015 1110   ALT 21 03/30/2015 1110   BILITOT 0.41 03/30/2015 1110        STUDIES: Renal ultrasound 03/07/2017 shows no obstructive uropathy. Simple cyst in the right kidney lower pole measures 4.5 cm.  Mammography and ultrasonography is scheduled for 03/16/2017  ASSESSMENT: 82 y.o.  [place] woman   (1)  status post right lumpectomy 02/29/2012 for a pT1b cN0, stage IA invasive ductal carcinoma, grade 1, estrogen receptor 100% positive, progesterone receptor 6% positive, with an MIB-1 of 7%, and no HER-2 amplification.   (2)  began on anastrozole, 03/29/2012; completing 5 years June 2018  (a) bone density October 2013 was normal  (b) repeat bone density 07/10/2014 at [facility] again normal  PLAN:  Sihaam Is now 5 years out from definitive surgery for her breast cancer with no evidence of disease recurrence. This is very favorable.  She has completed 5 years of anastrozole. In some cases we suggest  continuing this drug and additional 2 years. However her initial prognosis is so good I don't think she would obtain any benefit from continuing. In fact it might be deleterious in terms of her bone health and other possible side effects. In particular the urinary tract infections she has experienced may be at least partly provoked by vaginal dryness issues, though she does not complain about that  At this point I feel comfortable releasing her to her primary care physician. All she will need in terms of breast cancer follow-up is yearly mammography and a yearly physician breast exam.  I will be glad to see her again at any point in the future if on when the need arises but as of now are making a further appointment for her here.  Chauncey Cruel, MD     03/14/2017

## 2019-05-14 ENCOUNTER — Encounter: Payer: Self-pay | Admitting: Oncology

## 2019-06-27 ENCOUNTER — Ambulatory Visit (INDEPENDENT_AMBULATORY_CARE_PROVIDER_SITE_OTHER): Payer: Medicare Other | Admitting: Neurology

## 2019-06-27 ENCOUNTER — Other Ambulatory Visit: Payer: Self-pay

## 2019-06-27 ENCOUNTER — Telehealth: Payer: Self-pay | Admitting: Neurology

## 2019-06-27 ENCOUNTER — Encounter: Payer: Self-pay | Admitting: Neurology

## 2019-06-27 VITALS — BP 160/83 | HR 74 | Temp 96.9°F | Ht 65.0 in | Wt 233.0 lb

## 2019-06-27 DIAGNOSIS — R4189 Other symptoms and signs involving cognitive functions and awareness: Secondary | ICD-10-CM

## 2019-06-27 DIAGNOSIS — R270 Ataxia, unspecified: Secondary | ICD-10-CM

## 2019-06-27 DIAGNOSIS — G629 Polyneuropathy, unspecified: Secondary | ICD-10-CM | POA: Diagnosis not present

## 2019-06-27 DIAGNOSIS — R413 Other amnesia: Secondary | ICD-10-CM

## 2019-06-27 DIAGNOSIS — R269 Unspecified abnormalities of gait and mobility: Secondary | ICD-10-CM | POA: Diagnosis not present

## 2019-06-27 NOTE — Patient Instructions (Signed)
MRI of the brain and blood work  Publishing rights manager Strategies  1. Use "WARM" strategy.  W= write it down  A= associate it  R= repeat it  M= make a mental note  2.   You can keep a Social worker.  Use a 3-ring notebook with sections for the following: calendar, important names and phone numbers,  medications, doctors' names/phone numbers, lists/reminders, and a section to journal what you did  each day.   3.    Use a calendar to write appointments down.  4.    Write yourself a schedule for the day.  This can be placed on the calendar or in a separate section of the Memory Notebook.  Keeping a  regular schedule can help memory.  5.    Use medication organizer with sections for each day or morning/evening pills.  You may need help loading it  6.    Keep a basket, or pegboard by the door.  Place items that you need to take out with you in the basket or on the pegboard.  You may also want to  include a message board for reminders.  7.    Use sticky notes.  Place sticky notes with reminders in a place where the task is performed.  For example: " turn off the  stove" placed by the stove, "lock the door" placed on the door at eye level, " take your medications" on  the bathroom mirror or by the place where you normally take your medications.  8.    Use alarms/timers.  Use while cooking to remind yourself to check on food or as a reminder to take your medicine, or as a  reminder to make a call, or as a reminder to perform another task, etc.

## 2019-06-27 NOTE — Progress Notes (Signed)
[facility]    Provider:  Dr Jaynee Eagles Requesting Provider: Leighton Ruff, MD Primary Care Provider:  Leighton Ruff, MD  CC:  Neuropathy and gait instability  HPI:  Lorraine Murphy is a 84 y.o. female here as requested by Leighton Ruff, MD for neuropathy and gait instability.  Past medical history type 2 diabetes, hypercholesterolemia, cataracts, schizophrenia, peripheral neuropathy, impaired mobility and ADLs, essential hypertension, memory loss, chronic kidney disease, diabetes mellitus with nephropathy. CKD. She has had neuropathy in the feet maybe 10 years, slowly progressive, she noticed 2 years ago getting worse, and now she can't feel the floor, she need to hold onto something to keep balance, the feet tingle and burn, coming up the legs, symmetric, no low back pain, no significant pain but she loses her balance and doesn't like to drive anymore. More short-term memory, she has to write everything down, if she can't think of something it finally comes to her, live alone, she performs all her own ADLs, IADLs, she is good at managing finances and also medications, she doesn't miss bills, not getting lost. Memory is worse since stopping exercise, maybe the last few months.  Reviewed notes, labs and imaging from outside physicians, which showed:  I reviewed Dr. Quay Burow notes.  She is having more problems with neuropathy.  She is not sure of the cause of her neuropathy.  She is using a walker in the house but finds it difficult to feel her feet.  She is gotten physical therapy in the past for the balance issues.  Patient is having some increasing memory problems specifically with names.  She has a history of schizophrenia and is followed by Dr. Hosie Spangle.  She is also on medication for depression.  She does not get out of the house.  Her hemoglobin A1c last checked in August 2020 was 6 but it has been higher in the past, CBC with differential unremarkable, CMP showed glucose  101, slightly lower GFR, TSH 4.28, creatinine 1.15,  Review of Systems: Patient complains of symptoms per HPI as well as the following symptoms: foot pain. Pertinent negatives and positives per HPI. All others negative.   Social History   Socioeconomic History  . Marital status: Divorced    Spouse name: Not on file  . Number of children: 2  . Years of education: 19+  . Highest education level: Master's degree (e.g., MA, MS, MEng, MEd, MSW, MBA)  Occupational History  . Not on file  Social Needs  . Financial resource strain: Not on file  . Food insecurity    Worry: Not on file    Inability: Not on file  . Transportation needs    Medical: Not on file    Non-medical: Not on file  Tobacco Use  . Smoking status: Never Smoker  . Smokeless tobacco: Never Used  Substance and Sexual Activity  . Alcohol use: Yes    Comment: socially   . Drug use: No  . Sexual activity: Not Currently    Comment: menarche age 76, age first pregnancy 30, hrt 10-15 years, post menopausal  Lifestyle  . Physical activity    Days per week: Not on file    Minutes per session: Not on file  . Stress: Not on file  Relationships  . Social Herbalist on phone: Not on file    Gets together: Not on file    Attends religious service: Not on file    Active member of club or organization: Not  on file    Attends meetings of clubs or organizations: Not on file    Relationship status: Not on file  . Intimate partner violence    Fear of current or ex partner: Not on file    Emotionally abused: Not on file    Physically abused: Not on file    Forced sexual activity: Not on file  Other Topics Concern  . Not on file  Social History Narrative   Lives at home alone    Family History  Problem Relation Age of Onset  . Lung cancer Father   . Other Maternal Aunt        brain tumor  . Other Paternal Aunt        brain tumor  . Skin cancer Brother   . Dementia Neg Hx        not that she knows of     Past Medical History:  Diagnosis Date  . AKI (acute kidney injury) ([place]) 03/07/2017  . Anxiety   . Arthritis   . Bilateral cataracts    no surgery  . Breast cancer ([place]) 02/29/12   right breast lumpectomy=invasive ductal cs ,ER?PR=positive her2 neu=neg  . Cancer ([place])   . Colon polyp   . Depression   . Diabetes mellitus    diet contolled  . GERD (gastroesophageal reflux disease)   . H/O bone density study   . H/O colonoscopy 2012  . Hematuria   . Hyperlipidemia   . Hypertension   . Incontinence   . Obesity   . Osteoarthritis   . Schizophrenia ([place]) 41 age   paranoid schzophrenia  . Skin cancer   . SUI (stress urinary incontinence, female)   . Wears glasses     Patient Active Problem List   Diagnosis Date Noted  . Schizophrenia ([place]) 03/07/2017  . Acute lower UTI 03/07/2017  . AKI (acute kidney injury) ([place]) 03/07/2017  . Acute encephalopathy 03/07/2017  . Hypokalemia 03/07/2017  . Essential hypertension 03/07/2017  . UTI (urinary tract infection) 03/07/2017  . Postmenopausal estrogen deficiency 03/26/2014  . Hypercalcemia 08/20/2013  . Skin cancer   . Anxiety   . Malignant neoplasm of upper-outer quadrant of right breast in female, estrogen receptor positive ([place]) 02/16/2012    Past Surgical History:  Procedure Laterality Date  . APPENDECTOMY    . BREAST SURGERY     right breast lumpectomy  . COLONOSCOPY    . SPIDER VEIN INJECTION    . TONSILECTOMY, ADENOIDECTOMY, BILATERAL MYRINGOTOMY AND TUBES    . TONSILLECTOMY    . TUBAL LIGATION      Current Outpatient Medications  Medication Sig Dispense Refill  . amLODipine (NORVASC) 5 MG tablet Take 5 mg by mouth daily.    Marland Kitchen aspirin 81 MG tablet Take 81 mg by mouth daily.    Marland Kitchen atorvastatin (LIPITOR) 40 MG tablet Take 40 mg by mouth daily.    Marland Kitchen buPROPion (WELLBUTRIN XL) 150 MG 24 hr tablet Take 300 mg by mouth daily.    . fish oil-omega-3 fatty acids 1000 MG capsule Take 1 g by mouth daily.     .  Glycerin-Polysorbate 80 (REFRESH DRY EYE THERAPY OP) Apply to eye daily as needed.    . haloperidol lactate (HALDOL) 5 MG/ML injection 0.4 mL injection once a month    . anastrozole (ARIMIDEX) 1 MG tablet TAKE 1 TABLET (1 MG TOTAL) BY MOUTH DAILY. 90 tablet 3  . haloperidol decanoate (HALDOL DECANOATE) 50 MG/ML injection every 28 (twenty-eight) days.  monthly     No current facility-administered medications for this visit.     Allergies as of 06/27/2019  . (No Known Allergies)    Vitals: BP (!) 160/83 (BP Location: Right Arm, Patient Position: Sitting)   Pulse 74   Temp (!) 96.9 F (36.1 C) Comment: taken at check in  Ht 5' 5"  (1.651 m)   Wt 233 lb (105.7 kg)   BMI 38.77 kg/m  Last Weight:  Wt Readings from Last 1 Encounters:  06/27/19 233 lb (105.7 kg)   Last Height:   Ht Readings from Last 1 Encounters:  06/27/19 5' 5"  (1.651 m)     Physical exam: Exam: Gen: NAD, conversant, well nourised, obese, well groomed                     CV: RRR, no MRG. No Carotid Bruits. No peripheral edema, warm, nontender Eyes: Conjunctivae clear without exudates or hemorrhage  Neuro: Detailed Neurologic Exam  Speech:    Speech is normal; fluent and spontaneous with normal comprehension.  Cognition:  MMSE - Mini Mental State Exam 06/27/2019  Orientation to time 5  Orientation to Place 5  Registration 3  Attention/ Calculation 2  Recall 3  Language- name 2 objects 2  Language- repeat 1  Language- follow 3 step command 3  Language- read & follow direction 1  Write a sentence 1  Copy design 1  Total score 27       The patient is oriented to person, place, and time;     recent and remote memory intact;     language fluent;     normal attention, concentration,     fund of knowledge Cranial Nerves:    The pupils are equal, round, and reactive to light.  Attempted funduscopic exam but could not visualize due to small pupils. Extraocular movements are intact. Trigeminal sensation  is intact and the muscles of mastication are normal. The face is symmetric. The palate elevates in the midline. Hearing intact. Voice is normal. Shoulder shrug is normal. The tongue has normal motion without fasciculations.   Coordination:    No dysmetria  Gait:    Uses a walking aid, a walker, slow steady gait, not shuffling, normal stance and clearance.  Motor Observation:    No asymmetry, no atrophy, and no involuntary movements noted. Tone:    Normal muscle tone.    Posture:    Slightly stooped    Strength:    Strength is V/V in the upper and lower limbs.      Sensation: intact to LT     Reflex Exam:  DTR's: Absent AJs. Deep tendon reflexes in the upper and lower extremities are symmetrical bilaterally.   Toes:    The toes are downgoing bilaterally.   Clonus:    Clonus is absent.    Assessment/Plan:  Really lovely 84 y.o. female here as requested by Leighton Ruff, MD for neuropathy and gait instability.  Past medical history type 2 diabetes, hypercholesterolemia, cataracts, schizophrenia, peripheral neuropathy, impaired mobility and ADLs, essential hypertension, memory loss, chronic kidney disease, diabetes mellitus with nephropathy. CKD. Memory is worse since stopping exercise, maybe the last few months only.  I don't think this patient has a degenerative neurocognitive disorder. Subjective memory complaints possibly multifactorial including: medication related, depression and anxiety and other psychiatric problems, being less active since [place], she reports only a few months of feeling this way since she can't exercise. No FHx of dementia either.  Neuropathy:  Risk factors include diabetes and CKD. Will check for other causes.   Memory loss: MRI brain, labs.   Fall risk, discussed   Orders Placed This Encounter  Procedures  . MR BRAIN W WO CONTRAST  . B12 and Folate Panel  . Methylmalonic acid, serum  . Vitamin B1  . Heavy metals, blood  . Vitamin B6  .  Multiple Myeloma Panel (SPEP&IFE w/QIG)  . Basic Metabolic Panel   Cc: Leighton Ruff, MD  Sarina Ill, MD  [facility] [address] [place], [place] [address]  Phone [phone] Fax [phone]

## 2019-06-27 NOTE — Telephone Encounter (Signed)
[employer] medicare order sent to [facility]. No auth they will reach out to the patient to schedule.  

## 2019-06-29 ENCOUNTER — Encounter: Payer: Self-pay | Admitting: Neurology

## 2019-07-04 LAB — VITAMIN B6: Vitamin B6: 2.9 ug/L (ref 2.0–32.8)

## 2019-07-04 LAB — HEAVY METALS, BLOOD
Arsenic: 7 ug/L (ref 2–23)
Lead, Blood: 2 ug/dL (ref 0–4)
Mercury: 1.3 ug/L (ref 0.0–14.9)

## 2019-07-04 LAB — BASIC METABOLIC PANEL
BUN/Creatinine Ratio: 17 (ref 12–28)
BUN: 18 mg/dL (ref 8–27)
CO2: 23 mmol/L (ref 20–29)
Calcium: 10.1 mg/dL (ref 8.7–10.3)
Chloride: 106 mmol/L (ref 96–106)
Creatinine, Ser: 1.05 mg/dL — ABNORMAL HIGH (ref 0.57–1.00)
GFR calc Af Amer: 57 mL/min/{1.73_m2} — ABNORMAL LOW (ref >59)
GFR calc non Af Amer: 50 mL/min/{1.73_m2} — ABNORMAL LOW (ref >59)
Glucose: 88 mg/dL (ref 65–99)
Potassium: 4.6 mmol/L (ref 3.5–5.2)
Sodium: 144 mmol/L (ref 134–144)

## 2019-07-04 LAB — MULTIPLE MYELOMA PANEL, SERUM
Albumin SerPl Elph-Mcnc: 3.7 g/dL (ref 2.9–4.4)
Albumin/Glob SerPl: 1.4 (ref 0.7–1.7)
Alpha 1: 0.2 g/dL (ref 0.0–0.4)
Alpha2 Glob SerPl Elph-Mcnc: 1 g/dL (ref 0.4–1.0)
B-Globulin SerPl Elph-Mcnc: 1 g/dL (ref 0.7–1.3)
Gamma Glob SerPl Elph-Mcnc: 0.6 g/dL (ref 0.4–1.8)
Globulin, Total: 2.8 g/dL (ref 2.2–3.9)
IgA/Immunoglobulin A, Serum: 260 mg/dL (ref 64–422)
IgG (Immunoglobin G), Serum: 682 mg/dL (ref 586–1602)
IgM (Immunoglobulin M), Srm: 73 mg/dL (ref 26–217)
Total Protein: 6.5 g/dL (ref 6.0–8.5)

## 2019-07-04 LAB — VITAMIN B1: Thiamine: 141.4 nmol/L (ref 66.5–200.0)

## 2019-07-04 LAB — B12 AND FOLATE PANEL
Folate: 11.1 ng/mL (ref >3.0)
Vitamin B-12: 307 pg/mL (ref 232–1245)

## 2019-07-04 LAB — METHYLMALONIC ACID, SERUM: Methylmalonic Acid: 211 nmol/L (ref 0–378)

## 2019-07-09 ENCOUNTER — Telehealth: Payer: Self-pay | Admitting: *Deleted

## 2019-07-09 NOTE — Telephone Encounter (Signed)
Spoke with pt and discussed lab results unremarkable per Dr. Jaynee Eagles, no concerns. The pt verbalized appreciation. She stated Dr. Drema Dallas office had just called and advised her to start taking B12 and stated she thinks they did labs too. Lab results from 06/27/2019 routed via fax to Dr. Drema Dallas office (referring provider).

## 2019-07-09 NOTE — Telephone Encounter (Signed)
That is fine, thanks 

## 2019-07-09 NOTE — Telephone Encounter (Signed)
-----   Message from Melvenia Beam, MD sent at 07/05/2019  2:31 PM EDT ----- Labs unremarkable, thanks

## 2019-07-24 ENCOUNTER — Other Ambulatory Visit: Payer: Self-pay

## 2019-07-24 ENCOUNTER — Other Ambulatory Visit: Payer: Medicare Other

## 2019-07-24 ENCOUNTER — Ambulatory Visit
Admission: RE | Admit: 2019-07-24 | Discharge: 2019-07-24 | Disposition: A | Discharge status: Home / Self Care | Payer: Medicare Other | Source: Ambulatory Visit | Attending: Neurology | Admitting: Neurology

## 2019-07-24 DIAGNOSIS — G629 Polyneuropathy, unspecified: Secondary | ICD-10-CM

## 2019-07-24 DIAGNOSIS — R269 Unspecified abnormalities of gait and mobility: Secondary | ICD-10-CM

## 2019-07-24 DIAGNOSIS — R270 Ataxia, unspecified: Secondary | ICD-10-CM

## 2019-07-24 DIAGNOSIS — R4189 Other symptoms and signs involving cognitive functions and awareness: Secondary | ICD-10-CM

## 2019-07-24 DIAGNOSIS — R413 Other amnesia: Secondary | ICD-10-CM

## 2019-07-24 MED ORDER — GADOBENATE DIMEGLUMINE 529 MG/ML IV SOLN
20.0000 mL | Freq: Once | INTRAVENOUS | Status: AC | PRN
  Administered 2019-07-24: 20 mL via INTRAVENOUS

## 2019-07-29 ENCOUNTER — Telehealth: Payer: Self-pay | Admitting: *Deleted

## 2019-07-29 NOTE — Telephone Encounter (Signed)
-----   Message from Melvenia Beam, MD sent at 07/29/2019  8:49 AM EDT ----- MRI of the brain is unremarkable for age, thanks

## 2019-07-29 NOTE — Telephone Encounter (Signed)
Spoke with pt and advised MRI brain is unremarkable for age. No concerns. Pt verbalized understanding and appreciation. She did not have any questions.

## 2019-10-16 ENCOUNTER — Encounter (HOSPITAL_COMMUNITY): Payer: Self-pay

## 2019-10-16 ENCOUNTER — Emergency Department (HOSPITAL_COMMUNITY): Payer: Medicare Other

## 2019-10-16 ENCOUNTER — Telehealth: Payer: Self-pay

## 2019-10-16 ENCOUNTER — Emergency Department (HOSPITAL_COMMUNITY)
Admission: EM | Admit: 2019-10-16 | Discharge: 2019-10-16 | Disposition: A | Discharge status: Home / Self Care | Payer: Medicare Other | Attending: Emergency Medicine | Admitting: Emergency Medicine

## 2019-10-16 DIAGNOSIS — S89391A Other physeal fracture of lower end of right fibula, initial encounter for closed fracture: Secondary | ICD-10-CM | POA: Diagnosis not present

## 2019-10-16 DIAGNOSIS — E119 Type 2 diabetes mellitus without complications: Secondary | ICD-10-CM | POA: Diagnosis not present

## 2019-10-16 DIAGNOSIS — S8991XA Unspecified injury of right lower leg, initial encounter: Secondary | ICD-10-CM | POA: Diagnosis present

## 2019-10-16 DIAGNOSIS — Y999 Unspecified external cause status: Secondary | ICD-10-CM | POA: Insufficient documentation

## 2019-10-16 DIAGNOSIS — I10 Essential (primary) hypertension: Secondary | ICD-10-CM | POA: Diagnosis not present

## 2019-10-16 DIAGNOSIS — Z79899 Other long term (current) drug therapy: Secondary | ICD-10-CM | POA: Diagnosis not present

## 2019-10-16 DIAGNOSIS — Y9389 Activity, other specified: Secondary | ICD-10-CM | POA: Diagnosis not present

## 2019-10-16 DIAGNOSIS — Z7982 Long term (current) use of aspirin: Secondary | ICD-10-CM | POA: Diagnosis not present

## 2019-10-16 DIAGNOSIS — W010XXA Fall on same level from slipping, tripping and stumbling without subsequent striking against object, initial encounter: Secondary | ICD-10-CM | POA: Insufficient documentation

## 2019-10-16 DIAGNOSIS — Y929 Unspecified place or not applicable: Secondary | ICD-10-CM | POA: Insufficient documentation

## 2019-10-16 DIAGNOSIS — S82831A Other fracture of upper and lower end of right fibula, initial encounter for closed fracture: Secondary | ICD-10-CM

## 2019-10-16 DIAGNOSIS — W19XXXA Unspecified fall, initial encounter: Secondary | ICD-10-CM

## 2019-10-16 NOTE — ED Notes (Signed)
[facility] has been dispatched.  

## 2019-10-16 NOTE — Telephone Encounter (Signed)
EDCSW received call from Rollene Fare, Mud[facility] at [facility], PTs AVS was lost en route to facility and more information was needed about follow up instructions. CSW and TOCRN were able to to supply needed information.

## 2019-10-16 NOTE — ED Triage Notes (Signed)
Patient arrived via [place] from [facility].   C/O fall and right ankle pain   Patient was trying to squat and pick something up off the floor and fell backwards and twisted her ankle and felt a pop.    Denies hitting head or LOC   A/Ox4

## 2019-10-16 NOTE — ED Provider Notes (Signed)
[facility] DEPT Provider Note   CSN: [id number] Arrival date & time: 10/16/19  0944     History Chief Complaint  Patient presents with  . Fall  . Ankle Pain    [name] is a 85 y.o. female.  HPI Patient presents after fall.  Was trying to pick something up and fell backwards.  States she felt a pop in her left knee and has pain in her right ankle.  Did not hit head or back.  No blood thinners.  States she feels fine otherwise.  She walks with a walker baseline.  She did have an orange for breakfast.    Past Medical History:  Diagnosis Date  . AKI (acute kidney injury) ([place]) 03/07/2017  . Anxiety   . Arthritis   . Bilateral cataracts    no surgery  . Breast cancer ([place]) 02/29/12   right breast lumpectomy=invasive ductal cs ,ER?PR=positive her2 neu=neg  . Cancer ([place])   . Colon polyp   . Depression   . Diabetes mellitus    diet contolled  . GERD (gastroesophageal reflux disease)   . H/O bone density study   . H/O colonoscopy 2012  . Hematuria   . Hyperlipidemia   . Hypertension   . Incontinence   . Obesity   . Osteoarthritis   . Schizophrenia ([place]) 3 age   paranoid schzophrenia  . Skin cancer   . SUI (stress urinary incontinence, female)   . Wears glasses     Patient Active Problem List   Diagnosis Date Noted  . Schizophrenia ([place]) 03/07/2017  . Acute lower UTI 03/07/2017  . AKI (acute kidney injury) ([place]) 03/07/2017  . Acute encephalopathy 03/07/2017  . Hypokalemia 03/07/2017  . Essential hypertension 03/07/2017  . UTI (urinary tract infection) 03/07/2017  . Postmenopausal estrogen deficiency 03/26/2014  . Hypercalcemia 08/20/2013  . Skin cancer   . Anxiety   . Malignant neoplasm of upper-outer quadrant of right breast in female, estrogen receptor positive ([place]) 02/16/2012    Past Surgical History:  Procedure Laterality Date  . APPENDECTOMY    . BREAST SURGERY     right breast lumpectomy  . COLONOSCOPY    .  SPIDER VEIN INJECTION    . TONSILECTOMY, ADENOIDECTOMY, BILATERAL MYRINGOTOMY AND TUBES    . TONSILLECTOMY    . TUBAL LIGATION       OB History    Gravida  2   Para  2   Term      Preterm      AB      Living        SAB      TAB      Ectopic      Multiple      Live Births           Obstetric Comments  Menarche age 54, HRT 10-15 years,        Family History  Problem Relation Age of Onset  . Lung cancer Father   . Other Maternal Aunt        brain tumor  . Other Paternal Aunt        brain tumor  . Skin cancer Brother   . Dementia Neg Hx        not that she knows of    Social History   Tobacco Use  . Smoking status: Never Smoker  . Smokeless tobacco: Never Used  Substance Use Topics  . Alcohol use: Yes    Comment:  socially   . Drug use: No    Home Medications Prior to Admission medications   Medication Sig Start Date End Date Taking? Authorizing Provider  amLODipine (NORVASC) 5 MG tablet Take 5 mg by mouth daily.    [provider]  anastrozole (ARIMIDEX) 1 MG tablet TAKE 1 TABLET (1 MG TOTAL) BY MOUTH DAILY. 12/07/15   Magrinat, Virgie Dad, MD  aspirin 81 MG tablet Take 81 mg by mouth daily.    [provider]  atorvastatin (LIPITOR) 40 MG tablet Take 40 mg by mouth daily.    [provider]  buPROPion (WELLBUTRIN XL) 150 MG 24 hr tablet Take 300 mg by mouth daily.    [provider]  fish oil-omega-3 fatty acids 1000 MG capsule Take 1 g by mouth daily.     [provider]  Glycerin-Polysorbate 80 (REFRESH DRY EYE THERAPY OP) Apply to eye daily as needed.    [provider]  haloperidol decanoate (HALDOL DECANOATE) 50 MG/ML injection every 28 (twenty-eight) days. monthly 02/06/12   [provider]  haloperidol lactate (HALDOL) 5 MG/ML injection 0.4 mL injection once a month    [provider]    Allergies    Patient has no known allergies.  Review of Systems   Review of Systems   Constitutional: Negative for appetite change.  HENT: Negative for congestion.   Gastrointestinal: Negative for abdominal pain.  Musculoskeletal: Negative for neck pain.       Right ankle pain.  Left knee popping.  Neurological: Negative for weakness and headaches.    Physical Exam Updated Vital Signs BP (!) 175/80   Pulse (!) 59   Temp 98.3 F (36.8 C) (Oral)   Resp 10   SpO2 94%   Physical Exam Vitals and nursing note reviewed.  Eyes:     Extraocular Movements: Extraocular movements intact.  Cardiovascular:     Rate and Rhythm: Normal rate.  Chest:     Chest wall: No tenderness.  Abdominal:     Tenderness: There is no abdominal tenderness.  Musculoskeletal:     Cervical back: Neck supple.     Comments:  mild tenderness to right ankle with some swelling laterally.  Tenderness on proximal tib-fib also.  Good range of motion knee.  Good range of motion ankle.  No crepitance.  Skin intact.  Good range of motion in left knee.  No tenderness over hips.  No lumbar or cervical tenderness.  Neurological:     Mental Status: She is alert.     ED Results / Procedures / Treatments   Labs (all labs ordered are listed, but only abnormal results are displayed) Labs Reviewed - No data to display  EKG None  Radiology DG Tibia/Fibula Right  Result Date: 10/16/2019 CLINICAL DATA:  Fall today. Right lateral ankle pain. EXAM: RIGHT TIBIA AND FIBULA - 2 VIEW COMPARISON:  Ankle radiographs same date. FINDINGS: Minimally displaced fracture of the distal right fibular diaphysis is noted as described on ankle radiographs. No proximal acute injuries are seen within the right lower leg. There are mild tricompartmental degenerative changes at the right knee. There is soft tissue swelling laterally at the right ankle. IMPRESSION: Distal right fibular diaphyseal fracture as described on ankle radiographs. No proximal acute findings in the right lower leg. Electronically Signed   By: Richardean Sale  M.D.   On: 10/16/2019 10:43   DG Ankle Complete Right  Result Date: 10/16/2019 CLINICAL DATA:  Right lateral ankle pain after falling  today. EXAM: RIGHT ANKLE - COMPLETE 3+ VIEW COMPARISON:  None. FINDINGS: There is a mildly displaced oblique fracture of the distal fibular diaphysis which is centered approximately 3.5 cm proximal to the tibiotalar joint. The distal tibia appears intact with mild spurring of the medial malleolus. There is no dislocation or evidence of tarsal bone fracture. Mild midfoot degenerative changes and calcaneal spurring are noted. There is mild lateral soft tissue swelling. IMPRESSION: Mildly displaced oblique fracture of the distal fibular diaphysis. No evidence of tarsal bone fracture. Electronically Signed   By: Richardean Sale M.D.   On: 10/16/2019 10:41   DG Knee Complete 4 Views Left  Result Date: 10/16/2019 CLINICAL DATA:  Fall today. Right ankle and left knee pain. EXAM: LEFT KNEE - COMPLETE 4+ VIEW COMPARISON:  None. FINDINGS: The mineralization and alignment are normal. There is no evidence of acute fracture or dislocation. There are tricompartmental degenerative changes which are advanced in the medial compartment where there is bone-on-bone apposition and osteophyte formation. No significant joint effusion. IMPRESSION: No acute osseous findings. Tricompartmental degenerative changes, advanced in the medial compartment. Electronically Signed   By: Richardean Sale M.D.   On: 10/16/2019 10:42    Procedures Procedures (including critical care time)  Medications Ordered in ED Medications - No data to display  ED Course  I have reviewed the triage vital signs and the nursing notes.  Pertinent labs & imaging results that were available during my care of the patient were reviewed by me and considered in my medical decision making (see chart for details).    MDM Rules/Calculators/A&P                     Patient with fall.  Distal fibula fracture.  No other  apparent injury.  Cam walker given.  Discharge home.  Final Clinical Impression(s) / ED Diagnoses Final diagnoses:  Fall, initial encounter  Closed fracture of distal end of right fibula, unspecified fracture morphology, initial encounter    Rx / DC Orders ED Discharge Orders    None       Davonna Belling, MD 10/16/19 1236

## 2019-10-16 NOTE — ED Notes (Signed)
Pure wick has been placed. Suction set to 45mmHg.  

## 2019-10-16 NOTE — ED Notes (Signed)
Ortho called for cam walker

## 2019-10-16 NOTE — ED Notes (Signed)
X-ray at bedside

## 2019-10-17 ENCOUNTER — Emergency Department (HOSPITAL_COMMUNITY)
Admission: EM | Admit: 2019-10-17 | Discharge: 2019-10-18 | Disposition: A | Discharge status: Skilled Nursing Facility | Payer: Medicare Other | Attending: Emergency Medicine | Admitting: Emergency Medicine

## 2019-10-17 DIAGNOSIS — Z79899 Other long term (current) drug therapy: Secondary | ICD-10-CM | POA: Insufficient documentation

## 2019-10-17 DIAGNOSIS — E119 Type 2 diabetes mellitus without complications: Secondary | ICD-10-CM | POA: Diagnosis not present

## 2019-10-17 DIAGNOSIS — X501XXD Overexertion from prolonged static or awkward postures, subsequent encounter: Secondary | ICD-10-CM | POA: Diagnosis not present

## 2019-10-17 DIAGNOSIS — Z7982 Long term (current) use of aspirin: Secondary | ICD-10-CM | POA: Diagnosis not present

## 2019-10-17 DIAGNOSIS — S82831D Other fracture of upper and lower end of right fibula, subsequent encounter for closed fracture with routine healing: Secondary | ICD-10-CM | POA: Insufficient documentation

## 2019-10-17 DIAGNOSIS — Z022 Encounter for examination for admission to residential institution: Secondary | ICD-10-CM | POA: Diagnosis present

## 2019-10-17 DIAGNOSIS — I10 Essential (primary) hypertension: Secondary | ICD-10-CM | POA: Insufficient documentation

## 2019-10-17 DIAGNOSIS — Z20822 Contact with and (suspected) exposure to covid-19: Secondary | ICD-10-CM | POA: Insufficient documentation

## 2019-10-17 DIAGNOSIS — Z853 Personal history of malignant neoplasm of breast: Secondary | ICD-10-CM | POA: Diagnosis not present

## 2019-10-17 DIAGNOSIS — Z7189 Other specified counseling: Secondary | ICD-10-CM | POA: Insufficient documentation

## 2019-10-17 DIAGNOSIS — Z85828 Personal history of other malignant neoplasm of skin: Secondary | ICD-10-CM | POA: Insufficient documentation

## 2019-10-17 LAB — RESPIRATORY PANEL BY RT PCR (FLU A&B, COVID)
Influenza A by PCR: NEGATIVE
Influenza B by PCR: NEGATIVE
SARS Coronavirus 2 by RT PCR: NEGATIVE

## 2019-10-17 MED ORDER — ASPIRIN EC 81 MG PO TBEC
81.0000 mg | DELAYED_RELEASE_TABLET | Freq: Every day | ORAL | Status: DC
  Administered 2019-10-18: 09:00:00 81 mg via ORAL
  Filled 2019-10-17 (×2): qty 1

## 2019-10-17 MED ORDER — AMLODIPINE BESYLATE 5 MG PO TABS
5.0000 mg | ORAL_TABLET | Freq: Every day | ORAL | Status: DC
  Administered 2019-10-18: 5 mg via ORAL
  Filled 2019-10-17 (×2): qty 1

## 2019-10-17 MED ORDER — ACETAMINOPHEN 325 MG PO TABS
650.0000 mg | ORAL_TABLET | Freq: Three times a day (TID) | ORAL | Status: DC | PRN
  Administered 2019-10-17 – 2019-10-18 (×3): 650 mg via ORAL
  Filled 2019-10-17 (×3): qty 2

## 2019-10-17 MED ORDER — ATORVASTATIN CALCIUM 40 MG PO TABS
40.0000 mg | ORAL_TABLET | Freq: Every day | ORAL | Status: DC
  Administered 2019-10-18: 09:00:00 40 mg via ORAL
  Filled 2019-10-17 (×2): qty 1

## 2019-10-17 MED ORDER — BUPROPION HCL ER (XL) 150 MG PO TB24
300.0000 mg | ORAL_TABLET | Freq: Every day | ORAL | Status: DC
  Administered 2019-10-18: 09:00:00 300 mg via ORAL
  Filled 2019-10-17 (×2): qty 2

## 2019-10-17 NOTE — Progress Notes (Addendum)
2nd shift ED CSW received a handoff from the 1st shift [facility] ED CSW.   CSW reviewed chart and noted that pt's referral was accepted for review by [facility].  CSW was told by Freda Munro at [facility] that pt's referral is under review for possible placement on 1/15.  CSW called and was informed by Claiborne Billings at [facility] that pt's referral was reviewed, and that a bed offer is now extended to the pt for admission on 10/18/19.  Claiborne Billings requested that [place] initiate the [facility] insurance auth and that while pt may be transported on 1/15 without an auth if the Josem Kaufmann is received on either 1/14 or on the morning of 1/15, then the pt may be transported on the morning of 1/5 once the go-ahead is given by Claiborne Billings at [facility].  CSW spoke to the pt who stated she prefers [facility] due to it being closer in proximity to [place] and stated she preferred to be closer to either [place] or to [place] where each of her sons lived.  CSW counseled pt that neither facility was likely allowing visitors at this time and pt voiced understanding.  CSW stated that if she or her sons changed their minds on 1/16 or another bed offer comes available on 1/15 that the pt or pt's family preferred then the auth can be switched to the preferred facility and pt voiced understanding.  Pt was appreciative and thanked the CSW and stated this could be discussed with her sons further on 1/16 and stated CSW Dept could call her sons on 1/16 as she did not want to bother sons further this late..  CSW called [employer]'s [employer] at ph: [phone] (fax: [phone]) and initiated the auth.  Case Reference #  [id number] was provided by [employer] at 4:45 pm on 10/17/19: [id number]   CSW is to put pending reference on the cover sheet and fax pt's clinicals to Fax: [phone]   Clinicals were faxed.  TOC CSW awaiting return call from [place] at [phone].  2nd shift ED CSW will leave handoff for 1st  shift ED CSW.  CSW will continue to follow for D/C needs.  Alphonse Guild. Clifford Benninger, LCSW, LCAS, CSI Transitions of Care Clinical Social Worker Care Coordination Department Ph: [phone]

## 2019-10-17 NOTE — ED Notes (Signed)
Up with assist to use bedside commode. Assisted back to bed and positioned. Complained of ankle pain of 3 and asked for Tylenol and was given it as ordered for the pain. Pleasant and cooperative.

## 2019-10-17 NOTE — TOC Initial Note (Signed)
Transition of Care [facility]) - Initial/Assessment Note    Patient Details  Name: Lorraine Murphy MRN: [id number] Date of Birth: [birth year]  Transition of Care [facility]) CM/SW Contact:    Janace Hoard, LCSW Phone Number: 10/17/2019, 12:33 PM  Clinical Narrative:                 CSW spoke with patient via bedside regarding going to SNF. Patient presents with a a broken fibula that she broke on yesterday. Patient was in the ED yesterday, but was discharged home to [facility] where she is on their independent living side. Patient reports she was unable to get around and the staff was not able to assist, so it was recommended patient seeking rehab. Patient reports she has never been to SNF before. Patient reports she uses a walker at home. Patient preferred to go to [facility], but CSW spoke with admission person there earlier and they report they will not have beds until Monday. Patient is open to other options.   CSW also spoke with Philippa Sicks, hospital leison with [facility] who reports they are hoping patient can go to SNF and then return home. Rollene Fare reports she has spoke with patient's son who is on board with patient going to SNF. PT consult and COVID test have been ordered. CSW will continue to follow up.   Expected Discharge Plan: Skilled Nursing Facility Barriers to Discharge: No Barriers Identified   Patient Goals and CMS Choice Patient states their goals for this hospitalization and ongoing recovery are:: To go to rehab      Expected Discharge Plan and Services Expected Discharge Plan: [place] In-house Referral: Clinical Social Work     Living arrangements for the past 2 months: [place]                                      Prior Living Arrangements/Services Living arrangements for the past 2 months: [place] Lives with:: Self Patient language and need for interpreter reviewed:: Yes Do you feel safe going  back to the place where you live?: Yes      Need for Family Participation in Patient Care: Yes (Comment) Care giver support system in place?: Yes (comment) Current home services: DME Criminal Activity/Legal Involvement Pertinent to Current Situation/Hospitalization: No - Comment as needed  Activities of Daily Living      Permission Sought/Granted Permission sought to share information with : Facility Sport and exercise psychologist    Share Information with NAME: Peter Garter  Permission granted to share info w AGENCY: SNFs  Permission granted to share info w Relationship: son  Permission granted to share info w Contact Information: [phone]  Emotional Assessment Appearance:: Appears stated age Attitude/Demeanor/Rapport: Engaged Affect (typically observed): Accepting, Hopeful, Appropriate Orientation: : Oriented to Self, Oriented to Place, Oriented to  Time, Oriented to Situation Alcohol / Substance Use: Not Applicable Psych Involvement: No (comment)  Admission diagnosis:  fall ankle pain Patient Active Problem List   Diagnosis Date Noted  . Schizophrenia ([place]) 03/07/2017  . Acute lower UTI 03/07/2017  . AKI (acute kidney injury) ([place]) 03/07/2017  . Acute encephalopathy 03/07/2017  . Hypokalemia 03/07/2017  . Essential hypertension 03/07/2017  . UTI (urinary tract infection) 03/07/2017  . Postmenopausal estrogen deficiency 03/26/2014  . Hypercalcemia 08/20/2013  . Skin cancer   . Anxiety   . Malignant neoplasm of upper-outer quadrant of right breast  in female, estrogen receptor positive ([place]) 02/16/2012   PCP:  Leighton Ruff, MD Pharmacy:   [id number] - [place], [place] [address] Phone: [phone] Fax: [phone]     Social Determinants of Health (SDOH) Interventions    Readmission Risk Interventions No flowsheet data found.

## 2019-10-17 NOTE — NC FL2 (Addendum)
[place] MEDICAID FL2 LEVEL OF CARE SCREENING TOOL     IDENTIFICATION  Patient Name: CATALENA REMLINGER Birthdate: 1935-02-12 Sex: female Admission Date (Current Location): 10/17/2019  [facility] and [place] Number:  Herbalist and Address:  [facility],  [place] [address], [place]      Provider Number: [id number]  Attending Physician Name and Address:  Default, Provider, MD  Relative Name and Phone Number:  Peter Garter (son) [phone]    Current Level of Care: Hospital Recommended Level of Care: [place] Prior Approval Number:    Date Approved/Denied:   PASRR Number: [id number]  Discharge Plan: SNF    Current Diagnoses: Patient Active Problem List   Diagnosis Date Noted  . Schizophrenia ([place]) 03/07/2017  . Acute lower UTI 03/07/2017  . AKI (acute kidney injury) ([place]) 03/07/2017  . Acute encephalopathy 03/07/2017  . Hypokalemia 03/07/2017  . Essential hypertension 03/07/2017  . UTI (urinary tract infection) 03/07/2017  . Postmenopausal estrogen deficiency 03/26/2014  . Hypercalcemia 08/20/2013  . Skin cancer   . Anxiety   . Malignant neoplasm of upper-outer quadrant of right breast in female, estrogen receptor positive ([place]) 02/16/2012    Orientation RESPIRATION BLADDER Height & Weight     Self, Time, Situation, Place  Normal Continent Weight:   Height:     BEHAVIORAL SYMPTOMS/MOOD NEUROLOGICAL BOWEL NUTRITION STATUS      Continent Diet(regular)  AMBULATORY STATUS COMMUNICATION OF NEEDS Skin   Limited Assist Verbally Normal                       Personal Care Assistance Level of Assistance  Bathing, Feeding, Dressing Bathing Assistance: Limited assistance Feeding assistance: Independent Dressing Assistance: Limited assistance     Functional Limitations Info  Sight, Speech, Hearing Sight Info: Adequate Hearing Info: Adequate Speech Info: Adequate    SPECIAL CARE FACTORS FREQUENCY  PT (By licensed  PT), OT (By licensed OT)     PT Frequency: 5x weekly OT Frequency: 5x weekly            Contractures Contractures Info: Not present    Additional Factors Info  Code Status, Allergies Code Status Info: Full Allergies Info: NKA           Current Medications (10/17/2019):  This is the current hospital active medication list Current Facility-Administered Medications  Medication Dose Route Frequency Provider Last Rate Last Admin  . amLODipine (NORVASC) tablet 5 mg  5 mg Oral Daily Valarie Merino, MD      . aspirin EC tablet 81 mg  81 mg Oral Daily Valarie Merino, MD      . atorvastatin (LIPITOR) tablet 40 mg  40 mg Oral Daily Valarie Merino, MD      . buPROPion (WELLBUTRIN XL) 24 hr tablet 300 mg  300 mg Oral Daily Valarie Merino, MD       Current Outpatient Medications  Medication Sig Dispense Refill  . amLODipine (NORVASC) 5 MG tablet Take 5 mg by mouth daily.    Marland Kitchen aspirin 81 MG tablet Take 81 mg by mouth daily.    Marland Kitchen atorvastatin (LIPITOR) 40 MG tablet Take 40 mg by mouth daily.    Marland Kitchen buPROPion (WELLBUTRIN XL) 300 MG 24 hr tablet Take 300 mg by mouth daily.     . Glycerin-Polysorbate 80 (REFRESH DRY EYE THERAPY OP) Place 1 drop into both eyes daily as needed (dry eyes).     . haloperidol decanoate (  HALDOL DECANOATE) 50 MG/ML injection every 28 (twenty-eight) days. monthly    . anastrozole (ARIMIDEX) 1 MG tablet TAKE 1 TABLET (1 MG TOTAL) BY MOUTH DAILY. (Patient not taking: Reported on 10/17/2019) 90 tablet 3  . haloperidol lactate (HALDOL) 5 MG/ML injection 0.4 mL injection once a month       Discharge Medications: Please see discharge summary for a list of discharge medications.  Relevant Imaging Results:  Relevant Lab Results:   Additional Information SS# [id number]  Janace Hoard, LCSW

## 2019-10-17 NOTE — Evaluation (Signed)
Physical Therapy Evaluation Patient Details Name: Lorraine Murphy MRN: [id number] DOB: May 05, 1935 Today's Date: 10/17/2019   History of Present Illness  Pt is an 85 year old female seen in the ED yesterday for a Closed fracture of distal end of right fibula.  Pt provided CAM walker and discharged back to ILF.  Pt presented again to ED today, 10/16/18 due to ILF feeling she required SNF level care.  Clinical Impression  Pt seen in ED for above diagnosis. Pt currently with functional limitations due to the deficits listed below (see PT Problem List). Pt will benefit from skilled PT to increase their independence and safety with mobility to allow discharge to the venue listed below.  Pt reports ILF did not feel able to provide her care after she sustained fibular fx and now requires CAM walker.  Pt does require at least mod assist for mobility at this time and would benefit from SNF upon d/c.  Pt uncertain of WBing status, none located by chart review, so nursing notified.  Pt encouraged to keep weight off R LE to allow healing as able with CAM walker until further clarification.      Follow Up Recommendations SNF    Equipment Recommendations  None recommended by PT    Recommendations for Other Services       Precautions / Restrictions Precautions Precautions: Fall Required Braces or Orthoses: Other Brace Other Brace: R LE CAM walker Restrictions Weight Bearing Restrictions: Yes Other Position/Activity Restrictions: uncertain of WBing restrictions as none listed on d/c summary yesterday however pt provided with CAM walker, staff 1/14 report MD stated she could ambulate to bathroom in room this morning      Mobility  Bed Mobility Overal bed mobility: Needs Assistance Bed Mobility: Supine to Sit;Sit to Supine     Supine to sit: Min guard;HOB elevated Sit to supine: Min guard   General bed mobility comments: pt utilized bed rail to self assist, increased effort  Transfers Overall  transfer level: Needs assistance Equipment used: Rolling walker (2 wheeled) Transfers: Sit to/from Stand Sit to Stand: Mod assist         General transfer comment: verbal cues for attempting to keep weight off R LE as able, utilize UE support through RW, assist to rise and steady especially from toilet surface (utilized grab bars)  Ambulation/Gait Ambulation/Gait assistance: Herbalist (Feet): 6 Feet(x2) Assistive device: Rolling walker (2 wheeled) Gait Pattern/deviations: Step-through pattern;Decreased stride length     General Gait Details: pt not able to maintain NWB so encouraged PWB at least (uncertain of WB at this time), pt only ambulated to/from bathroom (also had already performed same this morning), assist for steadying, effortful gait  Stairs            Wheelchair Mobility    Modified Rankin (Stroke Patients Only)       Balance Overall balance assessment: History of Falls                                           Pertinent Vitals/Pain Pain Assessment: 0-10 Pain Score: 5  Pain Location: R lower leg Pain Descriptors / Indicators: Aching;Sore Pain Intervention(s): Repositioned;Monitored during session;Patient requesting pain meds-RN notified    Home Living Family/patient expects to be discharged to:: Private residence     Type of Home: Independent living facility  Home Equipment: [place] - 2 wheels      Prior Function Level of Independence: Independent with assistive device(s)         Comments: typically ambulates with RW, uses dining hall at [facility]        Extremity/Trunk Assessment        Lower Extremity Assessment Lower Extremity Assessment: RLE deficits/detail;Generalized weakness;LLE deficits/detail RLE Deficits / Details: maintained CAM walker LLE Deficits / Details: pt reports hx of L LE "giving out"       Communication      Cognition Arousal/Alertness:  Awake/alert Behavior During Therapy: WFL for tasks assessed/performed Overall Cognitive Status: Within Functional Limits for tasks assessed                                        General Comments      Exercises     Assessment/Plan    PT Assessment Patient needs continued PT services  PT Problem List Decreased strength;Decreased balance;Decreased knowledge of use of DME;Pain;Decreased mobility       PT Treatment Interventions DME instruction;Therapeutic activities;Gait training;Therapeutic exercise;Patient/family education;Functional mobility training;Balance training;Wheelchair mobility training    PT Goals (Current goals can be found in the Care Plan section)  Acute Rehab PT Goals PT Goal Formulation: With patient Time For Goal Achievement: 10/31/19 Potential to Achieve Goals: Good    Frequency Min 2X/week   Barriers to discharge        Co-evaluation               AM-PAC PT "6 Clicks" Mobility  Outcome Measure Help needed turning from your back to your side while in a flat bed without using bedrails?: A Little Help needed moving from lying on your back to sitting on the side of a flat bed without using bedrails?: A Little Help needed moving to and from a bed to a chair (including a wheelchair)?: A Lot Help needed standing up from a chair using your arms (e.g., wheelchair or bedside chair)?: A Lot Help needed to walk in hospital room?: A Little Help needed climbing 3-5 steps with a railing? : A Lot 6 Click Score: 15    End of Session Equipment Utilized During Treatment: Gait belt Activity Tolerance: Patient tolerated treatment well Patient left: in bed;with call bell/phone within reach Nurse Communication: Mobility status PT Visit Diagnosis: Other abnormalities of gait and mobility (R26.89);Unsteadiness on feet (R26.81)    Time: [id number] PT Time Calculation (min) (ACUTE ONLY): 21 min   Charges:   PT Evaluation $PT Eval Low Complexity: 1  Low     Kati PT, DPT Acute Rehabilitation Services Office: [phone]   Trena Platt 10/17/2019, 3:39 PM

## 2019-10-17 NOTE — ED Triage Notes (Signed)
Pt brought in by EMS due to fractured ankle yesterday and her nursing home is unable to care for her as they are independent care only.  She is needing a skilled nursing home.

## 2019-10-17 NOTE — ED Provider Notes (Signed)
[facility] DEPT Provider Note   CSN: [id number] Arrival date & time: 10/17/19  1030     History Chief Complaint  Patient presents with  . Nursing Home Placement    Lorraine Murphy is a 85 y.o. female.  85 year old female with prior medical history as documented below presents for placement evaluation.  Patient reports that she has a fractured right ankle.  She is not able to ambulate on her own. She resides at an independent living facility.  She has been sent to the ED for possible placement into a skilled nursing facility.  Patient is otherwise without specific acute complaint.  The history is provided by the patient and medical records.       Past Medical History:  Diagnosis Date  . AKI (acute kidney injury) ([place]) 03/07/2017  . Anxiety   . Arthritis   . Bilateral cataracts    no surgery  . Breast cancer ([place]) 02/29/12   right breast lumpectomy=invasive ductal cs ,ER?PR=positive her2 neu=neg  . Cancer ([place])   . Colon polyp   . Depression   . Diabetes mellitus    diet contolled  . GERD (gastroesophageal reflux disease)   . H/O bone density study   . H/O colonoscopy 2012  . Hematuria   . Hyperlipidemia   . Hypertension   . Incontinence   . Obesity   . Osteoarthritis   . Schizophrenia ([place]) 85 age   paranoid schzophrenia  . Skin cancer   . SUI (stress urinary incontinence, female)   . Wears glasses     Patient Active Problem List   Diagnosis Date Noted  . Schizophrenia ([place]) 03/07/2017  . Acute lower UTI 03/07/2017  . AKI (acute kidney injury) ([place]) 03/07/2017  . Acute encephalopathy 03/07/2017  . Hypokalemia 03/07/2017  . Essential hypertension 03/07/2017  . UTI (urinary tract infection) 03/07/2017  . Postmenopausal estrogen deficiency 03/26/2014  . Hypercalcemia 08/20/2013  . Skin cancer   . Anxiety   . Malignant neoplasm of upper-outer quadrant of right breast in female, estrogen receptor positive ([place]) 02/16/2012     Past Surgical History:  Procedure Laterality Date  . APPENDECTOMY    . BREAST SURGERY     right breast lumpectomy  . COLONOSCOPY    . SPIDER VEIN INJECTION    . TONSILECTOMY, ADENOIDECTOMY, BILATERAL MYRINGOTOMY AND TUBES    . TONSILLECTOMY    . TUBAL LIGATION       OB History    Gravida  2   Para  2   Term      Preterm      AB      Living        SAB      TAB      Ectopic      Multiple      Live Births           Obstetric Comments  Menarche age 36, HRT 10-15 years,        Family History  Problem Relation Age of Onset  . Lung cancer Father   . Other Maternal Aunt        brain tumor  . Other Paternal Aunt        brain tumor  . Skin cancer Brother   . Dementia Neg Hx        not that she knows of    Social History   Tobacco Use  . Smoking status: Never Smoker  . Smokeless tobacco: Never Used  Substance Use Topics  . Alcohol use: Yes    Comment: socially   . Drug use: No    Home Medications Prior to Admission medications   Medication Sig Start Date End Date Taking? Authorizing Provider  amLODipine (NORVASC) 5 MG tablet Take 5 mg by mouth daily.    [provider]  anastrozole (ARIMIDEX) 1 MG tablet TAKE 1 TABLET (1 MG TOTAL) BY MOUTH DAILY. 12/07/15   Magrinat, Virgie Dad, MD  aspirin 81 MG tablet Take 81 mg by mouth daily.    [provider]  atorvastatin (LIPITOR) 40 MG tablet Take 40 mg by mouth daily.    [provider]  buPROPion (WELLBUTRIN XL) 150 MG 24 hr tablet Take 300 mg by mouth daily.    [provider]  fish oil-omega-3 fatty acids 1000 MG capsule Take 1 g by mouth daily.     [provider]  Glycerin-Polysorbate 80 (REFRESH DRY EYE THERAPY OP) Apply to eye daily as needed.    [provider]  haloperidol decanoate (HALDOL DECANOATE) 50 MG/ML injection every 28 (twenty-eight) days. monthly 02/06/12   [provider]  haloperidol lactate (HALDOL) 5 MG/ML injection 0.4  mL injection once a month    [provider]    Allergies    Patient has no known allergies.  Review of Systems   Review of Systems  All other systems reviewed and are negative.   Physical Exam Updated Vital Signs BP (!) 170/78 (BP Location: Right Arm)   Pulse 72   Temp 98.6 F (37 C) (Oral)   Resp 17   SpO2 96%   Physical Exam Vitals and nursing note reviewed.  Constitutional:      General: She is not in acute distress.    Appearance: She is well-developed.  HENT:     Head: Normocephalic and atraumatic.  Eyes:     Conjunctiva/sclera: Conjunctivae normal.     Pupils: Pupils are equal, round, and reactive to light.  Cardiovascular:     Rate and Rhythm: Normal rate and regular rhythm.     Heart sounds: Normal heart sounds.  Pulmonary:     Effort: Pulmonary effort is normal. No respiratory distress.     Breath sounds: Normal breath sounds.  Abdominal:     General: There is no distension.     Palpations: Abdomen is soft.     Tenderness: There is no abdominal tenderness.  Musculoskeletal:        General: No deformity. Normal range of motion.     Cervical back: Normal range of motion and neck supple.     Comments: Distal RLE is immobilized in a walking boot.   Skin:    General: Skin is warm and dry.  Neurological:     General: No focal deficit present.     Mental Status: She is alert and oriented to person, place, and time.     ED Results / Procedures / Treatments   Labs (all labs ordered are listed, but only abnormal results are displayed) Labs Reviewed  RESPIRATORY PANEL BY RT PCR (FLU A&B, COVID)    EKG None  Radiology DG Tibia/Fibula Right  Result Date: 10/16/2019 CLINICAL DATA:  Fall today. Right lateral ankle pain. EXAM: RIGHT TIBIA AND FIBULA - 2 VIEW COMPARISON:  Ankle radiographs same date. FINDINGS: Minimally displaced fracture of the distal right fibular diaphysis is noted as described on ankle radiographs. No proximal acute injuries are  seen within the right lower leg. There are mild  tricompartmental degenerative changes at the right knee. There is soft tissue swelling laterally at the right ankle. IMPRESSION: Distal right fibular diaphyseal fracture as described on ankle radiographs. No proximal acute findings in the right lower leg. Electronically Signed   By: Richardean Sale M.D.   On: 10/16/2019 10:43   DG Ankle Complete Right  Result Date: 10/16/2019 CLINICAL DATA:  Right lateral ankle pain after falling today. EXAM: RIGHT ANKLE - COMPLETE 3+ VIEW COMPARISON:  None. FINDINGS: There is a mildly displaced oblique fracture of the distal fibular diaphysis which is centered approximately 3.5 cm proximal to the tibiotalar joint. The distal tibia appears intact with mild spurring of the medial malleolus. There is no dislocation or evidence of tarsal bone fracture. Mild midfoot degenerative changes and calcaneal spurring are noted. There is mild lateral soft tissue swelling. IMPRESSION: Mildly displaced oblique fracture of the distal fibular diaphysis. No evidence of tarsal bone fracture. Electronically Signed   By: Richardean Sale M.D.   On: 10/16/2019 10:41   DG Knee Complete 4 Views Left  Result Date: 10/16/2019 CLINICAL DATA:  Fall today. Right ankle and left knee pain. EXAM: LEFT KNEE - COMPLETE 4+ VIEW COMPARISON:  None. FINDINGS: The mineralization and alignment are normal. There is no evidence of acute fracture or dislocation. There are tricompartmental degenerative changes which are advanced in the medial compartment where there is bone-on-bone apposition and osteophyte formation. No significant joint effusion. IMPRESSION: No acute osseous findings. Tricompartmental degenerative changes, advanced in the medial compartment. Electronically Signed   By: Richardean Sale M.D.   On: 10/16/2019 10:42    Procedures Procedures (including critical care time)  Medications Ordered in ED Medications - No data to display  ED Course  I  have reviewed the triage vital signs and the nursing notes.  Pertinent labs & imaging results that were available during my care of the patient were reviewed by me and considered in my medical decision making (see chart for details).    MDM Rules/Calculators/A&P                      MDM  Screen complete  Lorraine Murphy was evaluated in Emergency Department on 10/17/2019 for the symptoms described in the history of present illness. She was evaluated in the context of the global COVID-19 pandemic, which necessitated consideration that the patient might be at risk for infection with the SARS-CoV-2 virus that causes COVID-19. Institutional protocols and algorithms that pertain to the evaluation of patients at risk for COVID-19 are in a state of rapid change based on information released by regulatory bodies including the CDC and federal and state organizations. These policies and algorithms were followed during the patient's care in the ED.   Patient is presenting for evaluation and possible placement to a skilled nursing facility.  Social work is aware of the case and is actively pursuing options.  Final disposition is dependent on social work plan of care -- likely placement to SNF pending.   Final Clinical Impression(s) / ED Diagnoses Final diagnoses:  Closed fracture of distal end of right fibula with routine healing, unspecified fracture morphology, subsequent encounter    Rx / DC Orders ED Discharge Orders    None       Valarie Merino, MD 10/17/19 1409

## 2019-10-18 LAB — CBG MONITORING, ED: Glucose-Capillary: 112 mg/dL — ABNORMAL HIGH (ref 70–99)

## 2019-10-18 NOTE — ED Notes (Signed)
10/18/2019  1030  Report called to [facility]. Report given to [facility]. [facility] called.

## 2019-10-18 NOTE — ED Notes (Signed)
SW Johnathon just spoke with her about disposition tomorrow to go to an [place] based skilled nursing center for a time while she rehabs with her broken ankle. She states she is OK with the information and able to go back to sleep. States her ankle is feeling much better but would like to take Tylenol again in am.

## 2019-10-18 NOTE — ED Notes (Signed)
Awake and had wet the bed. Transferred her to the recliner and linen changed. Tylenol given per her request as ankle pain reported at 3 from all the movement and bearing weight on it.

## 2019-10-18 NOTE — Progress Notes (Signed)
[facility] called for transport.  

## 2019-10-18 NOTE — Progress Notes (Signed)
CSW spoke with Claiborne Billings with [facility] who reports patient can be accepted today. Patient will be going to room 2B and number for report is [phone]. CSW to fax AVS to [facility] at [phone]. Patient can be transported via [facility] at [facility], LCSW Transitions of [place] ED [phone]

## 2020-02-01 ENCOUNTER — Ambulatory Visit: Payer: Medicare Other | Attending: Internal Medicine

## 2020-02-01 DIAGNOSIS — Z23 Encounter for immunization: Secondary | ICD-10-CM

## 2020-02-01 NOTE — Progress Notes (Signed)
   Covid-19 Vaccination Clinic  Name:  Lorraine Murphy    MRN: [id number] DOB: June 18, 1935  02/01/2020  [name] was observed post Covid-19 immunization for 15 minutes without incident. She was provided with Vaccine Information Sheet and instruction to access the V-Safe system.   [name] was instructed to call 911 with any severe reactions post vaccine: Marland Kitchen Difficulty breathing  . Swelling of face and throat  . A fast heartbeat  . A bad rash all over body  . Dizziness and weakness   Immunizations Administered    Name Date Dose VIS Date Route   Moderna COVID-19 Vaccine 02/01/2020 10:38 AM 0.5 mL 09/2019 Intramuscular   Manufacturer: Moderna   LotFP:[id number]   NeedlesBE:[id number]

## 2020-04-08 ENCOUNTER — Other Ambulatory Visit: Payer: Self-pay

## 2020-04-08 ENCOUNTER — Ambulatory Visit: Payer: Medicare Other | Admitting: Podiatry

## 2020-04-08 DIAGNOSIS — L989 Disorder of the skin and subcutaneous tissue, unspecified: Secondary | ICD-10-CM

## 2020-04-09 ENCOUNTER — Encounter: Payer: Self-pay | Admitting: Podiatry

## 2020-04-09 NOTE — Progress Notes (Signed)
Subjective:  Patient ID: Lorraine Murphy, female    DOB: 1934/11/28,  MRN: [id number]  Chief Complaint  Patient presents with  . Callouses    Bilateral sub 1st callouses, painful, 6 month duration.    85 y.o. female presents with the above complaint.  Patient presents with complaint of bilateral hyperkeratotic lesion submetatarsal 1 at the IPJ level that has been going on for about a year has progressive gotten worse.  Patient states that they are painful to ambulate on.  She has not been able to debride down herself.  She states is likely has to do with the new shoes.  She has worked on switching the new shoes back out.  She denies any other acute complaints.  She has not seen anyone else prior to see me.  She would like for me to debride down the hyperkeratotic lesion to make sure that there is nothing concerning going on underneath.  She is also interested in getting toe protector as well.   Review of Systems: Negative except as noted in the HPI. Denies N/V/F/Ch.  Past Medical History:  Diagnosis Date  . AKI (acute kidney injury) ([place]) 03/07/2017  . Anxiety   . Arthritis   . Bilateral cataracts    no surgery  . Breast cancer ([place]) 02/29/12   right breast lumpectomy=invasive ductal cs ,ER?PR=positive her2 neu=neg  . Cancer ([place])   . Colon polyp   . Depression   . Diabetes mellitus    diet contolled  . GERD (gastroesophageal reflux disease)   . H/O bone density study   . H/O colonoscopy 2012  . Hematuria   . Hyperlipidemia   . Hypertension   . Incontinence   . Obesity   . Osteoarthritis   . Schizophrenia ([place]) 37 age   paranoid schzophrenia  . Skin cancer   . SUI (stress urinary incontinence, female)   . Wears glasses     Current Outpatient Medications:  .  amLODipine (NORVASC) 5 MG tablet, Take 5 mg by mouth daily., Disp: , Rfl:  .  anastrozole (ARIMIDEX) 1 MG tablet, TAKE 1 TABLET (1 MG TOTAL) BY MOUTH DAILY., Disp: 90 tablet, Rfl: 3 .  aspirin 81 MG tablet, Take 81 mg  by mouth daily., Disp: , Rfl:  .  Aspirin Buf,CaCarb-MgCarb-MgO, 81 MG TABS, aspirin 81 mg tablet   81 mg by oral route., Disp: , Rfl:  .  atorvastatin (LIPITOR) 40 MG tablet, Take 40 mg by mouth daily., Disp: , Rfl:  .  buPROPion (WELLBUTRIN XL) 300 MG 24 hr tablet, Take 300 mg by mouth daily. , Disp: , Rfl:  .  Glycerin-Polysorbate 80 (REFRESH DRY EYE THERAPY OP), Place 1 drop into both eyes daily as needed (dry eyes). , Disp: , Rfl:  .  haloperidol decanoate (HALDOL DECANOATE) 50 MG/ML injection, every 28 (twenty-eight) days. monthly, Disp: , Rfl:  .  haloperidol lactate (HALDOL) 5 MG/ML injection, 0.4 mL injection once a month, Disp: , Rfl:  .  Omega-3 Fatty Acids (FISH OIL) 1000 MG CAPS, docosahexaenoic acid (dha)-epa 120 mg-180 mg capsule   1 g by oral route., Disp: , Rfl:   Social History   Tobacco Use  Smoking Status Never Smoker  Smokeless Tobacco Never Used    No Known Allergies Objective:  There were no vitals filed for this visit. There is no height or weight on file to calculate BMI. Constitutional Well developed. Well nourished.  Vascular Dorsalis pedis pulses palpable bilaterally. Posterior tibial pulses palpable bilaterally. Capillary  refill normal to all digits.  No cyanosis or clubbing noted. Pedal hair growth normal.  Neurologic Normal speech. Oriented to person, place, and time. Epicritic sensation to light touch grossly present bilaterally.  Dermatologic  bilateral hyperkeratotic lesion noted on the plantar aspect of the interphalangeal joint of both the hallux is.  No pinpoint bleeding noted upon debridement.  No underlying ulceration noted.  Orthopedic: Normal joint ROM without pain or crepitus bilaterally. No visible deformities. No bony tenderness.   Radiographs: None Assessment:   1. Benign skin lesion    Plan:  Patient was evaluated and treated and all questions answered.  Bilateral hallux IPJ benign skin lesion -I explained patient the etiology  of benign skin lesion various treatment options were discussed.  I explained to her that this is likely attributed to the biomechanics of her foot and the way she is ambulating leading to excessive pressure to the interphalangeal joint as opposed to metatarsophalangeal joint.  Patient states understanding. -Using chisel blade and a handle of the hyperkeratotic lesions were debrided down to healthy striated tissue.  No pinpoint bleeding noted.  No underlying ulceration noted.  No complication noted. -Toe protectors were dispensed to prevent pressure. -I encouraged her to undergo shoe gear modification as well.  No follow-ups on file.

## 2020-07-10 ENCOUNTER — Ambulatory Visit: Payer: Medicare Other | Admitting: Podiatry

## 2020-10-06 DIAGNOSIS — M6281 Muscle weakness (generalized): Secondary | ICD-10-CM | POA: Diagnosis not present

## 2020-10-06 DIAGNOSIS — R2681 Unsteadiness on feet: Secondary | ICD-10-CM | POA: Diagnosis not present

## 2020-10-07 DIAGNOSIS — R2681 Unsteadiness on feet: Secondary | ICD-10-CM | POA: Diagnosis not present

## 2020-10-07 DIAGNOSIS — M6281 Muscle weakness (generalized): Secondary | ICD-10-CM | POA: Diagnosis not present

## 2020-10-08 DIAGNOSIS — M6281 Muscle weakness (generalized): Secondary | ICD-10-CM | POA: Diagnosis not present

## 2020-10-08 DIAGNOSIS — R2681 Unsteadiness on feet: Secondary | ICD-10-CM | POA: Diagnosis not present

## 2020-10-13 DIAGNOSIS — R2681 Unsteadiness on feet: Secondary | ICD-10-CM | POA: Diagnosis not present

## 2020-10-13 DIAGNOSIS — M6281 Muscle weakness (generalized): Secondary | ICD-10-CM | POA: Diagnosis not present

## 2020-10-16 DIAGNOSIS — M6281 Muscle weakness (generalized): Secondary | ICD-10-CM | POA: Diagnosis not present

## 2020-10-16 DIAGNOSIS — R2681 Unsteadiness on feet: Secondary | ICD-10-CM | POA: Diagnosis not present

## 2020-10-17 DIAGNOSIS — M6281 Muscle weakness (generalized): Secondary | ICD-10-CM | POA: Diagnosis not present

## 2020-10-17 DIAGNOSIS — R2681 Unsteadiness on feet: Secondary | ICD-10-CM | POA: Diagnosis not present

## 2020-10-19 DIAGNOSIS — R2681 Unsteadiness on feet: Secondary | ICD-10-CM | POA: Diagnosis not present

## 2020-10-19 DIAGNOSIS — M6281 Muscle weakness (generalized): Secondary | ICD-10-CM | POA: Diagnosis not present

## 2020-10-20 DIAGNOSIS — M6281 Muscle weakness (generalized): Secondary | ICD-10-CM | POA: Diagnosis not present

## 2020-10-20 DIAGNOSIS — R2681 Unsteadiness on feet: Secondary | ICD-10-CM | POA: Diagnosis not present

## 2020-10-22 DIAGNOSIS — M6281 Muscle weakness (generalized): Secondary | ICD-10-CM | POA: Diagnosis not present

## 2020-10-22 DIAGNOSIS — R2681 Unsteadiness on feet: Secondary | ICD-10-CM | POA: Diagnosis not present

## 2020-10-25 DIAGNOSIS — R262 Difficulty in walking, not elsewhere classified: Secondary | ICD-10-CM | POA: Diagnosis not present

## 2020-10-25 DIAGNOSIS — Z9181 History of falling: Secondary | ICD-10-CM | POA: Diagnosis not present

## 2020-10-25 DIAGNOSIS — I10 Essential (primary) hypertension: Secondary | ICD-10-CM | POA: Diagnosis not present

## 2020-10-25 DIAGNOSIS — U071 COVID-19: Secondary | ICD-10-CM | POA: Diagnosis not present

## 2020-10-26 DIAGNOSIS — M6281 Muscle weakness (generalized): Secondary | ICD-10-CM | POA: Diagnosis not present

## 2020-10-26 DIAGNOSIS — R2681 Unsteadiness on feet: Secondary | ICD-10-CM | POA: Diagnosis not present

## 2020-10-27 DIAGNOSIS — R2681 Unsteadiness on feet: Secondary | ICD-10-CM | POA: Diagnosis not present

## 2020-10-27 DIAGNOSIS — M6281 Muscle weakness (generalized): Secondary | ICD-10-CM | POA: Diagnosis not present

## 2020-10-30 DIAGNOSIS — M6281 Muscle weakness (generalized): Secondary | ICD-10-CM | POA: Diagnosis not present

## 2020-10-30 DIAGNOSIS — R2681 Unsteadiness on feet: Secondary | ICD-10-CM | POA: Diagnosis not present

## 2020-11-02 DIAGNOSIS — E7800 Pure hypercholesterolemia, unspecified: Secondary | ICD-10-CM | POA: Diagnosis not present

## 2020-11-02 DIAGNOSIS — R2681 Unsteadiness on feet: Secondary | ICD-10-CM | POA: Diagnosis not present

## 2020-11-02 DIAGNOSIS — E1169 Type 2 diabetes mellitus with other specified complication: Secondary | ICD-10-CM | POA: Diagnosis not present

## 2020-11-02 DIAGNOSIS — M6281 Muscle weakness (generalized): Secondary | ICD-10-CM | POA: Diagnosis not present

## 2020-11-02 DIAGNOSIS — I10 Essential (primary) hypertension: Secondary | ICD-10-CM | POA: Diagnosis not present

## 2020-11-02 DIAGNOSIS — R946 Abnormal results of thyroid function studies: Secondary | ICD-10-CM | POA: Diagnosis not present

## 2020-11-04 DIAGNOSIS — M6281 Muscle weakness (generalized): Secondary | ICD-10-CM | POA: Diagnosis not present

## 2020-11-04 DIAGNOSIS — R2681 Unsteadiness on feet: Secondary | ICD-10-CM | POA: Diagnosis not present

## 2020-11-25 DIAGNOSIS — Z9181 History of falling: Secondary | ICD-10-CM | POA: Diagnosis not present

## 2020-11-25 DIAGNOSIS — U071 COVID-19: Secondary | ICD-10-CM | POA: Diagnosis not present

## 2020-11-25 DIAGNOSIS — R262 Difficulty in walking, not elsewhere classified: Secondary | ICD-10-CM | POA: Diagnosis not present

## 2020-11-25 DIAGNOSIS — I10 Essential (primary) hypertension: Secondary | ICD-10-CM | POA: Diagnosis not present

## 2020-12-16 DIAGNOSIS — L84 Corns and callosities: Secondary | ICD-10-CM | POA: Diagnosis not present

## 2020-12-16 DIAGNOSIS — M25551 Pain in right hip: Secondary | ICD-10-CM | POA: Diagnosis not present

## 2020-12-23 DIAGNOSIS — R262 Difficulty in walking, not elsewhere classified: Secondary | ICD-10-CM | POA: Diagnosis not present

## 2020-12-23 DIAGNOSIS — I10 Essential (primary) hypertension: Secondary | ICD-10-CM | POA: Diagnosis not present

## 2020-12-23 DIAGNOSIS — U071 COVID-19: Secondary | ICD-10-CM | POA: Diagnosis not present

## 2020-12-23 DIAGNOSIS — Z9181 History of falling: Secondary | ICD-10-CM | POA: Diagnosis not present

## 2020-12-30 ENCOUNTER — Ambulatory Visit (INDEPENDENT_AMBULATORY_CARE_PROVIDER_SITE_OTHER): Payer: Medicare Other | Admitting: Podiatry

## 2020-12-30 ENCOUNTER — Encounter: Payer: Self-pay | Admitting: Podiatry

## 2020-12-30 ENCOUNTER — Other Ambulatory Visit: Payer: Self-pay

## 2020-12-30 DIAGNOSIS — L97512 Non-pressure chronic ulcer of other part of right foot with fat layer exposed: Secondary | ICD-10-CM

## 2021-01-01 ENCOUNTER — Encounter: Payer: Self-pay | Admitting: Podiatry

## 2021-01-01 NOTE — Progress Notes (Signed)
Subjective:  Patient ID: Lorraine Murphy, female    DOB: 1935/03/04,  MRN: [id number]  Chief Complaint  Patient presents with  . Callouses    Callus/corn on right foot     86 y.o. female presents for wound care.  Patient presents with now right submetatarsal 1 ulceration with fat layer exposed.  Patient states is gotten worse.  Used to be just a callus.  She wants to get it evaluated.  She is not a diabetic.  She denies any other acute complaints.  She would like to discuss treatment options for this.   Review of Systems: Negative except as noted in the HPI. Denies N/V/F/Ch.  Past Medical History:  Diagnosis Date  . AKI (acute kidney injury) ([place]) 03/07/2017  . Anxiety   . Arthritis   . Bilateral cataracts    no surgery  . Breast cancer ([place]) 02/29/12   right breast lumpectomy=invasive ductal cs ,ER?PR=positive her2 neu=neg  . Cancer ([place])   . Colon polyp   . Depression   . Diabetes mellitus    diet contolled  . GERD (gastroesophageal reflux disease)   . H/O bone density study   . H/O colonoscopy 2012  . Hematuria   . Hyperlipidemia   . Hypertension   . Incontinence   . Obesity   . Osteoarthritis   . Schizophrenia ([place]) 5 age   paranoid schzophrenia  . Skin cancer   . SUI (stress urinary incontinence, female)   . Wears glasses     Current Outpatient Medications:  .  amLODipine (NORVASC) 5 MG tablet, Take 5 mg by mouth daily., Disp: , Rfl:  .  anastrozole (ARIMIDEX) 1 MG tablet, TAKE 1 TABLET (1 MG TOTAL) BY MOUTH DAILY., Disp: 90 tablet, Rfl: 3 .  aspirin 81 MG tablet, Take 81 mg by mouth daily., Disp: , Rfl:  .  Aspirin Buf,CaCarb-MgCarb-MgO, 81 MG TABS, aspirin 81 mg tablet   81 mg by oral route., Disp: , Rfl:  .  atorvastatin (LIPITOR) 40 MG tablet, Take 40 mg by mouth daily., Disp: , Rfl:  .  buPROPion (WELLBUTRIN XL) 300 MG 24 hr tablet, Take 300 mg by mouth daily. , Disp: , Rfl:  .  Glycerin-Polysorbate 80 (REFRESH DRY EYE THERAPY OP), Place 1 drop into both eyes  daily as needed (dry eyes). , Disp: , Rfl:  .  haloperidol decanoate (HALDOL DECANOATE) 50 MG/ML injection, every 28 (twenty-eight) days. monthly, Disp: , Rfl:  .  haloperidol lactate (HALDOL) 5 MG/ML injection, 0.4 mL injection once a month, Disp: , Rfl:  .  Omega-3 Fatty Acids (FISH OIL) 1000 MG CAPS, docosahexaenoic acid (dha)-epa 120 mg-180 mg capsule   1 g by oral route., Disp: , Rfl:   Social History   Tobacco Use  Smoking Status Never Smoker  Smokeless Tobacco Never Used    No Known Allergies Objective:  There were no vitals filed for this visit. There is no height or weight on file to calculate BMI. Constitutional Well developed. Well nourished.  Vascular Dorsalis pedis pulses palpable bilaterally. Posterior tibial pulses palpable bilaterally. Capillary refill normal to all digits.  No cyanosis or clubbing noted. Pedal hair growth normal.  Neurologic Normal speech. Oriented to person, place, and time. Protective sensation absent  Dermatologic Wound Location: Right submetatarsal 1 with fat layer exposed does not probe down to bone.  No clinical signs of infection noted. Wound Base: Mixed Granular/Fibrotic Peri-wound: Calloused Exudate: Scant/small amount Serosanguinous exudate Wound Measurements: -See below  Orthopedic: No pain  to palpation either foot.   Radiographs: None Assessment:   1. Right foot ulcer, with fat layer exposed ([place])    Plan:  Patient was evaluated and treated and all questions answered.  Ulcer right submetatarsal 1 ulceration with fat layer exposed -Debridement as below. -Dressed with triple antibiotic, DSD. -Continue off-loading with surgical shoe.  Procedure: Excisional Debridement of Wound Tool: Sharp chisel blade/tissue nipper Rationale: Removal of non-viable soft tissue from the wound to promote healing.  Anesthesia: none Pre-Debridement Wound Measurements: 0.5 cm x 0.4 cm x 0.3 cm  Post-Debridement Wound Measurements: 0.7 cm x 0.4  cm x 0.3 cm  Type of Debridement: Sharp Excisional Tissue Removed: Non-viable soft tissue Blood loss: Minimal (<50cc) Depth of Debridement: subcutaneous tissue. Technique: Sharp excisional debridement to bleeding, viable wound base.  Wound Progress: This is my initial evaluation I will continue to monitor the progression of it. Site healing conversation 7 Dressing: Dry, sterile, compression dressing. Disposition: Patient tolerated procedure well. Patient to return in 1 week for follow-up.  No follow-ups on file.

## 2021-01-22 ENCOUNTER — Other Ambulatory Visit: Payer: Self-pay

## 2021-01-22 ENCOUNTER — Ambulatory Visit: Payer: Medicare Other | Admitting: Podiatry

## 2021-01-22 DIAGNOSIS — Z87898 Personal history of other specified conditions: Secondary | ICD-10-CM | POA: Diagnosis not present

## 2021-01-22 DIAGNOSIS — L97512 Non-pressure chronic ulcer of other part of right foot with fat layer exposed: Secondary | ICD-10-CM

## 2021-01-27 ENCOUNTER — Encounter: Payer: Self-pay | Admitting: Podiatry

## 2021-01-27 NOTE — Progress Notes (Signed)
Subjective:  Patient ID: Lorraine Murphy, female    DOB: 1934/10/31,  MRN: [id number]  Chief Complaint  Patient presents with  . Foot Ulcer    Right foot ulcer PT stated that she is doing okay she does have some pain    86 y.o. female presents for wound care.  Patient presents with now right submetatarsal 1 ulceration with fat layer exposed.  She states she is doing a lot better she does not have much pain.  She has been doing dressing changes.  She denies any other acute complaints.   Review of Systems: Negative except as noted in the HPI. Denies N/V/F/Ch.  Past Medical History:  Diagnosis Date  . AKI (acute kidney injury) ([place]) 03/07/2017  . Anxiety   . Arthritis   . Bilateral cataracts    no surgery  . Breast cancer ([place]) 02/29/12   right breast lumpectomy=invasive ductal cs ,ER?PR=positive her2 neu=neg  . Cancer ([place])   . Colon polyp   . Depression   . Diabetes mellitus    diet contolled  . GERD (gastroesophageal reflux disease)   . H/O bone density study   . H/O colonoscopy 2012  . Hematuria   . Hyperlipidemia   . Hypertension   . Incontinence   . Obesity   . Osteoarthritis   . Schizophrenia ([place]) 39 age   paranoid schzophrenia  . Skin cancer   . SUI (stress urinary incontinence, female)   . Wears glasses     Current Outpatient Medications:  .  amLODipine (NORVASC) 5 MG tablet, Take 5 mg by mouth daily., Disp: , Rfl:  .  anastrozole (ARIMIDEX) 1 MG tablet, TAKE 1 TABLET (1 MG TOTAL) BY MOUTH DAILY., Disp: 90 tablet, Rfl: 3 .  aspirin 81 MG tablet, Take 81 mg by mouth daily., Disp: , Rfl:  .  Aspirin Buf,CaCarb-MgCarb-MgO, 81 MG TABS, aspirin 81 mg tablet   81 mg by oral route., Disp: , Rfl:  .  atorvastatin (LIPITOR) 40 MG tablet, Take 40 mg by mouth daily., Disp: , Rfl:  .  buPROPion (WELLBUTRIN XL) 300 MG 24 hr tablet, Take 300 mg by mouth daily. , Disp: , Rfl:  .  Glycerin-Polysorbate 80 (REFRESH DRY EYE THERAPY OP), Place 1 drop into both eyes daily as needed  (dry eyes). , Disp: , Rfl:  .  haloperidol decanoate (HALDOL DECANOATE) 50 MG/ML injection, every 28 (twenty-eight) days. monthly, Disp: , Rfl:  .  haloperidol lactate (HALDOL) 5 MG/ML injection, 0.4 mL injection once a month, Disp: , Rfl:  .  Omega-3 Fatty Acids (FISH OIL) 1000 MG CAPS, docosahexaenoic acid (dha)-epa 120 mg-180 mg capsule   1 g by oral route., Disp: , Rfl:   Social History   Tobacco Use  Smoking Status Never Smoker  Smokeless Tobacco Never Used    No Known Allergies Objective:  There were no vitals filed for this visit. There is no height or weight on file to calculate BMI. Constitutional Well developed. Well nourished.  Vascular Dorsalis pedis pulses palpable bilaterally. Posterior tibial pulses palpable bilaterally. Capillary refill normal to all digits.  No cyanosis or clubbing noted. Pedal hair growth normal.  Neurologic Normal speech. Oriented to person, place, and time. Protective sensation absent  Dermatologic Wound Location: Right submetatarsal 1 ulceration mildly epithelialized.  No clinical signs of infection.  No concern for we ulceration at this time.  Orthopedic: No pain to palpation either foot.   Radiographs: None Assessment:   No diagnosis found. Plan:  Patient  was evaluated and treated and all questions answered.  Ulcer right submetatarsal 1 ulceration with fat layer exposed -Clinically healed and we epithelialized.  At this time I discussed with her more prevention and offloading.  I believe given that she has a history of ulceration she will benefit from shoes with offloading.  I did discuss with her given that she is not diabetic they may not cover it.  She states understanding.Marland Kitchen  She will be scheduled to see the orthotics department to be casted for diabetic shoes. No follow-ups on file.

## 2021-02-12 ENCOUNTER — Ambulatory Visit: Payer: Medicare Other | Admitting: Podiatry

## 2021-02-16 ENCOUNTER — Other Ambulatory Visit: Payer: Self-pay

## 2021-02-16 ENCOUNTER — Ambulatory Visit: Payer: Medicare Other | Admitting: Podiatry

## 2021-02-16 DIAGNOSIS — E119 Type 2 diabetes mellitus without complications: Secondary | ICD-10-CM | POA: Diagnosis not present

## 2021-02-16 DIAGNOSIS — M21612 Bunion of left foot: Secondary | ICD-10-CM | POA: Diagnosis not present

## 2021-02-16 DIAGNOSIS — Z87898 Personal history of other specified conditions: Secondary | ICD-10-CM | POA: Diagnosis not present

## 2021-02-16 DIAGNOSIS — M21611 Bunion of right foot: Secondary | ICD-10-CM | POA: Diagnosis not present

## 2021-02-17 NOTE — Progress Notes (Addendum)
Subjective:  Patient ID: Lorraine Murphy, female    DOB: 1934-12-04,  MRN: [id number]  Chief Complaint  Patient presents with  . Foot Ulcer    Right foot ulcer     86 y.o. female presents for wound care.  Patient presents now with a follow-up of completely epithelialized submetatarsal 1 ulceration.  No further ulcer noted.  She is awaiting diabetic shoes.   Review of Systems: Negative except as noted in the HPI. Denies N/V/F/Ch.  Past Medical History:  Diagnosis Date  . AKI (acute kidney injury) ([place]) 03/07/2017  . Anxiety   . Arthritis   . Bilateral cataracts    no surgery  . Breast cancer ([place]) 02/29/12   right breast lumpectomy=invasive ductal cs ,ER?PR=positive her2 neu=neg  . Cancer ([place])   . Colon polyp   . Depression   . Diabetes mellitus    diet contolled  . GERD (gastroesophageal reflux disease)   . H/O bone density study   . H/O colonoscopy 2012  . Hematuria   . Hyperlipidemia   . Hypertension   . Incontinence   . Obesity   . Osteoarthritis   . Schizophrenia ([place]) 34 age   paranoid schzophrenia  . Skin cancer   . SUI (stress urinary incontinence, female)   . Wears glasses     Current Outpatient Medications:  .  amLODipine (NORVASC) 5 MG tablet, Take 5 mg by mouth daily., Disp: , Rfl:  .  anastrozole (ARIMIDEX) 1 MG tablet, TAKE 1 TABLET (1 MG TOTAL) BY MOUTH DAILY., Disp: 90 tablet, Rfl: 3 .  aspirin 81 MG tablet, Take 81 mg by mouth daily., Disp: , Rfl:  .  Aspirin Buf,CaCarb-MgCarb-MgO, 81 MG TABS, aspirin 81 mg tablet   81 mg by oral route., Disp: , Rfl:  .  atorvastatin (LIPITOR) 40 MG tablet, Take 40 mg by mouth daily., Disp: , Rfl:  .  buPROPion (WELLBUTRIN XL) 300 MG 24 hr tablet, Take 300 mg by mouth daily. , Disp: , Rfl:  .  Glycerin-Polysorbate 80 (REFRESH DRY EYE THERAPY OP), Place 1 drop into both eyes daily as needed (dry eyes). , Disp: , Rfl:  .  haloperidol decanoate (HALDOL DECANOATE) 50 MG/ML injection, every 28 (twenty-eight) days. monthly,  Disp: , Rfl:  .  haloperidol lactate (HALDOL) 5 MG/ML injection, 0.4 mL injection once a month, Disp: , Rfl:  .  Omega-3 Fatty Acids (FISH OIL) 1000 MG CAPS, docosahexaenoic acid (dha)-epa 120 mg-180 mg capsule   1 g by oral route., Disp: , Rfl:   Social History   Tobacco Use  Smoking Status Never Smoker  Smokeless Tobacco Never Used    No Known Allergies Objective:  There were no vitals filed for this visit. There is no height or weight on file to calculate BMI. Constitutional Well developed. Well nourished.  Vascular Dorsalis pedis pulses palpable bilaterally. Posterior tibial pulses palpable bilaterally. Capillary refill normal to all digits.  No cyanosis or clubbing noted. Pedal hair growth normal.  Neurologic Normal speech. Oriented to person, place, and time. Protective sensation absent  Dermatologic Wound Location: Right submetatarsal 1 ulceration mildly epithelialized.  No clinical signs of infection.  No concern for we ulceration at this time.  Patient has bunion deformity noted bilaterally that could predispose her to getting an ulceration  Orthopedic: No pain to palpation either foot.   Radiographs: None Assessment:   1. History of ulceration   2. Bilateral bunions   3. Diabetes mellitus without complication ([place])  Plan:  Patient was evaluated and treated and all questions answered.  Right submetatarsal 1 history of ulceration with bunion deformity -Clinically healed and we epithelialized.  At this time I discussed with her more prevention and offloading.  I believe given that she has a history of ulceration she will benefit from shoes with offloading.  I did discuss with her given that she is not diabetic they may not cover it.  She states understanding.Marland Kitchen  She will be scheduled to see the orthotics department to be casted for diabetic shoes as patient has foot deformity with previous history of ulceration. Return if symptoms worsen or fail to improve.

## 2021-02-24 ENCOUNTER — Other Ambulatory Visit: Payer: Self-pay

## 2021-02-24 ENCOUNTER — Ambulatory Visit (INDEPENDENT_AMBULATORY_CARE_PROVIDER_SITE_OTHER): Payer: Medicare Other | Admitting: Podiatry

## 2021-02-24 DIAGNOSIS — E119 Type 2 diabetes mellitus without complications: Secondary | ICD-10-CM

## 2021-02-24 DIAGNOSIS — L97512 Non-pressure chronic ulcer of other part of right foot with fat layer exposed: Secondary | ICD-10-CM

## 2021-02-24 DIAGNOSIS — M21611 Bunion of right foot: Secondary | ICD-10-CM

## 2021-02-24 DIAGNOSIS — M21612 Bunion of left foot: Secondary | ICD-10-CM

## 2021-02-24 NOTE — Progress Notes (Signed)
Patient presented for foam casting for 3 pair custom diabetic shoe inserts.  Patient is measured with a brannock device to be a size 10 medium  Diabetic shoes are chosen from the [employer] catalog.   The shoes chosen are [id number]  The patient will be contacted when the shoes and inserts are ready to be picked up.

## 2021-03-05 ENCOUNTER — Encounter: Payer: Self-pay | Admitting: Oncology

## 2021-04-08 ENCOUNTER — Emergency Department (HOSPITAL_COMMUNITY): Payer: Medicare Other

## 2021-04-08 ENCOUNTER — Encounter (HOSPITAL_COMMUNITY): Payer: Self-pay | Admitting: *Deleted

## 2021-04-08 ENCOUNTER — Emergency Department (HOSPITAL_COMMUNITY)
Admission: EM | Admit: 2021-04-08 | Discharge: 2021-04-08 | Disposition: A | Discharge status: Home / Self Care | Payer: Medicare Other | Attending: Emergency Medicine | Admitting: Emergency Medicine

## 2021-04-08 DIAGNOSIS — I10 Essential (primary) hypertension: Secondary | ICD-10-CM | POA: Diagnosis not present

## 2021-04-08 DIAGNOSIS — E119 Type 2 diabetes mellitus without complications: Secondary | ICD-10-CM | POA: Diagnosis not present

## 2021-04-08 DIAGNOSIS — Z853 Personal history of malignant neoplasm of breast: Secondary | ICD-10-CM | POA: Diagnosis not present

## 2021-04-08 DIAGNOSIS — Z79899 Other long term (current) drug therapy: Secondary | ICD-10-CM | POA: Insufficient documentation

## 2021-04-08 DIAGNOSIS — R42 Dizziness and giddiness: Secondary | ICD-10-CM | POA: Insufficient documentation

## 2021-04-08 DIAGNOSIS — Z7982 Long term (current) use of aspirin: Secondary | ICD-10-CM | POA: Diagnosis not present

## 2021-04-08 LAB — COMPREHENSIVE METABOLIC PANEL
ALT: 40 U/L (ref 0–44)
AST: 34 U/L (ref 15–41)
Albumin: 3.5 g/dL (ref 3.5–5.0)
Alkaline Phosphatase: 74 U/L (ref 38–126)
Anion gap: 10 (ref 5–15)
BUN: 20 mg/dL (ref 8–23)
CO2: 26 mmol/L (ref 22–32)
Calcium: 9.3 mg/dL (ref 8.9–10.3)
Chloride: 103 mmol/L (ref 98–111)
Creatinine, Ser: 1.22 mg/dL — ABNORMAL HIGH (ref 0.44–1.00)
GFR, Estimated: 44 mL/min — ABNORMAL LOW (ref >60)
Glucose, Bld: 125 mg/dL — ABNORMAL HIGH (ref 70–99)
Potassium: 3.6 mmol/L (ref 3.5–5.1)
Sodium: 139 mmol/L (ref 135–145)
Total Bilirubin: 0.7 mg/dL (ref 0.3–1.2)
Total Protein: 6.6 g/dL (ref 6.5–8.1)

## 2021-04-08 LAB — CBC WITH DIFFERENTIAL/PLATELET
Abs Immature Granulocytes: 0.06 10*3/uL (ref 0.00–0.07)
Basophils Absolute: 0 10*3/uL (ref 0.0–0.1)
Basophils Relative: 0 %
Eosinophils Absolute: 0 10*3/uL (ref 0.0–0.5)
Eosinophils Relative: 0 %
HCT: 42.5 % (ref 36.0–46.0)
Hemoglobin: 13.7 g/dL (ref 12.0–15.0)
Immature Granulocytes: 1 %
Lymphocytes Relative: 18 %
Lymphs Abs: 1.9 10*3/uL (ref 0.7–4.0)
MCH: 31.4 pg (ref 26.0–34.0)
MCHC: 32.2 g/dL (ref 30.0–36.0)
MCV: 97.5 fL (ref 80.0–100.0)
Monocytes Absolute: 1 10*3/uL (ref 0.1–1.0)
Monocytes Relative: 9 %
Neutro Abs: 7.2 10*3/uL (ref 1.7–7.7)
Neutrophils Relative %: 72 %
Platelets: 275 10*3/uL (ref 150–400)
RBC: 4.36 MIL/uL (ref 3.87–5.11)
RDW: 13.6 % (ref 11.5–15.5)
WBC: 10.2 10*3/uL (ref 4.0–10.5)
nRBC: 0 % (ref 0.0–0.2)

## 2021-04-08 LAB — CBG MONITORING, ED: Glucose-Capillary: 110 mg/dL — ABNORMAL HIGH (ref 70–99)

## 2021-04-08 LAB — TROPONIN I (HIGH SENSITIVITY): Troponin I (High Sensitivity): 3 ng/L (ref <18)

## 2021-04-08 MED ORDER — MECLIZINE HCL 25 MG PO TABS: 25.0000 mg | ORAL_TABLET | Freq: Three times a day (TID) | ORAL | 0 refills | Status: AC | PRN

## 2021-04-08 MED ORDER — ONDANSETRON HCL 4 MG/2ML IJ SOLN
4.0000 mg | Freq: Once | INTRAMUSCULAR | Status: AC
  Administered 2021-04-08: 4 mg via INTRAVENOUS
  Filled 2021-04-08: qty 2

## 2021-04-08 MED ORDER — MECLIZINE HCL 25 MG PO TABS
25.0000 mg | ORAL_TABLET | Freq: Once | ORAL | Status: AC
  Administered 2021-04-08: 25 mg via ORAL
  Filled 2021-04-08: qty 1

## 2021-04-08 MED ORDER — SODIUM CHLORIDE 0.9 % IV BOLUS
1000.0000 mL | Freq: Once | INTRAVENOUS | Status: AC
  Administered 2021-04-08: 1000 mL via INTRAVENOUS

## 2021-04-08 MED ORDER — ONDANSETRON 4 MG PO TBDP: ORAL_TABLET | ORAL | 0 refills | Status: AC

## 2021-04-08 NOTE — ED Triage Notes (Signed)
Per EMS, pt from [facility]. She complains of dizziness x 1 week, more frequent falls over the past month EKG NSR.  CBG 165 BP 130/68 HR 73 RR 18 nonlabored O2 97% RA

## 2021-04-08 NOTE — Discharge Instructions (Addendum)
Drink plenty of fluids.  Rest at home for couple days.  Follow-up with your doctor next week for recheck

## 2021-04-08 NOTE — ED Provider Notes (Signed)
[facility] DEPT Provider Note   CSN: [id number] Arrival date & time: 04/08/21  1235     History Chief Complaint  Patient presents with   Dizziness    Lorraine Murphy is a 86 y.o. female.  Patient states that she has been experiencing some dizziness.  She states that the room seems to be spinning around.  The history is provided by the patient and medical records.  Dizziness Quality:  Head spinning Severity:  Mild Onset quality:  Sudden Duration: one day. Timing:  Constant Progression:  Waxing and waning Chronicity:  New Context: bending over   Relieved by:  Nothing Worsened by:  Nothing Ineffective treatments:  None tried Associated symptoms: no blood in stool, no chest pain, no diarrhea and no headaches       Past Medical History:  Diagnosis Date   AKI (acute kidney injury) ([place]) 03/07/2017   Anxiety    Arthritis    Bilateral cataracts    no surgery   Breast cancer ([place]) 02/29/12   right breast lumpectomy=invasive ductal cs ,ER?PR=positive her2 neu=neg   Cancer (HCC)    Colon polyp    Depression    Diabetes mellitus    diet contolled   GERD (gastroesophageal reflux disease)    H/O bone density study    H/O colonoscopy 2012   Hematuria    Hyperlipidemia    Hypertension    Incontinence    Obesity    Osteoarthritis    Schizophrenia ([place]) 82 age   paranoid schzophrenia   Skin cancer    SUI (stress urinary incontinence, female)    Wears glasses     Patient Active Problem List   Diagnosis Date Noted   Schizophrenia ([place]) 03/07/2017   Acute lower UTI 03/07/2017   AKI (acute kidney injury) ([place]) 03/07/2017   Acute encephalopathy 03/07/2017   Hypokalemia 03/07/2017   Essential hypertension 03/07/2017   UTI (urinary tract infection) 03/07/2017   Postmenopausal estrogen deficiency 03/26/2014   Hypercalcemia 08/20/2013   Skin cancer    Anxiety    Malignant neoplasm of upper-outer quadrant of right breast in female, estrogen  receptor positive ([place]) 02/16/2012    Past Surgical History:  Procedure Laterality Date   APPENDECTOMY     BREAST SURGERY     right breast lumpectomy   COLONOSCOPY     SPIDER VEIN INJECTION     TONSILECTOMY, ADENOIDECTOMY, BILATERAL MYRINGOTOMY AND TUBES     TONSILLECTOMY     TUBAL LIGATION       OB History     Gravida  2   Para  2   Term      Preterm      AB      Living         SAB      IAB      Ectopic      Multiple      Live Births           Obstetric Comments  Menarche age 76, HRT 10-15 years,         Family History  Problem Relation Age of Onset   Lung cancer Father    Other Maternal Aunt        brain tumor   Other Paternal Aunt        brain tumor   Skin cancer Brother    Dementia Neg Hx        not that she knows of    Social History  Tobacco Use   Smoking status: Never   Smokeless tobacco: Never  Substance Use Topics   Alcohol use: Yes    Comment: socially    Drug use: No    Home Medications Prior to Admission medications   Medication Sig Start Date End Date Taking? Authorizing Provider  meclizine (ANTIVERT) 25 MG tablet Take 1 tablet (25 mg total) by mouth 3 (three) times daily as needed for dizziness. 04/08/21  Yes Milton Ferguson, MD  ondansetron (ZOFRAN ODT) 4 MG disintegrating tablet 45m ODT q4 hours prn nausea/vomit 04/08/21  Yes ZMilton Ferguson MD  amLODipine (NORVASC) 5 MG tablet Take 5 mg by mouth daily.    [provider]  anastrozole (ARIMIDEX) 1 MG tablet TAKE 1 TABLET (1 MG TOTAL) BY MOUTH DAILY. 12/07/15   Magrinat, GVirgie Dad MD  aspirin 81 MG tablet Take 81 mg by mouth daily.    [provider]  Aspirin Buf,CaCarb-MgCarb-MgO, 81 MG TABS aspirin 81 mg tablet   81 mg by oral route.    [provider]  atorvastatin (LIPITOR) 40 MG tablet Take 40 mg by mouth daily.    [provider]  buPROPion (WELLBUTRIN XL) 300 MG 24 hr tablet Take 300 mg by mouth daily.     [provider]   Glycerin-Polysorbate 80 (REFRESH DRY EYE THERAPY OP) Place 1 drop into both eyes daily as needed (dry eyes).     [provider]  haloperidol decanoate (HALDOL DECANOATE) 50 MG/ML injection every 28 (twenty-eight) days. monthly 02/06/12   [provider]  haloperidol lactate (HALDOL) 5 MG/ML injection 0.4 mL injection once a month    [provider]  Omega-3 Fatty Acids (FISH OIL) 1000 MG CAPS docosahexaenoic acid (dha)-epa 120 mg-180 mg capsule   1 g by oral route.    [provider]    Allergies    Patient has no known allergies.  Review of Systems   Review of Systems  Constitutional:  Negative for appetite change and fatigue.  HENT:  Negative for congestion, ear discharge and sinus pressure.   Eyes:  Negative for discharge.  Respiratory:  Negative for cough.   Cardiovascular:  Negative for chest pain.  Gastrointestinal:  Negative for abdominal pain, blood in stool and diarrhea.  Genitourinary:  Negative for frequency and hematuria.  Musculoskeletal:  Negative for back pain.  Skin:  Negative for rash.  Neurological:  Positive for dizziness. Negative for seizures and headaches.  Psychiatric/Behavioral:  Negative for hallucinations.    Physical Exam Updated Vital Signs BP (!) 186/85   Pulse 67   Temp 98 F (36.7 C) (Oral)   Resp 11   SpO2 95%   Physical Exam Vitals and nursing note reviewed. Exam conducted with a chaperone present.  Constitutional:      Appearance: She is well-developed.  HENT:     Head: Normocephalic.     Nose: Nose normal.  Eyes:     General: No scleral icterus.    Conjunctiva/sclera: Conjunctivae normal.  Neck:     Thyroid: No thyromegaly.  Cardiovascular:     Rate and Rhythm: Normal rate and regular rhythm.     Heart sounds: No murmur heard.   No friction rub. No gallop.  Pulmonary:     Breath sounds: No stridor. No wheezing or rales.  Chest:     Chest wall: No tenderness.  Abdominal:     General: There is  no distension.     Tenderness: There is no abdominal tenderness. There  is no rebound.  Musculoskeletal:        General: Normal range of motion.     Cervical back: Neck supple.  Lymphadenopathy:     Cervical: No cervical adenopathy.  Skin:    Findings: No erythema or rash.  Neurological:     Mental Status: She is alert and oriented to person, place, and time.     Motor: No abnormal muscle tone.     Coordination: Coordination normal.  Psychiatric:        Behavior: Behavior normal.    ED Results / Procedures / Treatments   Labs (all labs ordered are listed, but only abnormal results are displayed) Labs Reviewed  COMPREHENSIVE METABOLIC PANEL - Abnormal; Notable for the following components:      Result Value   Glucose, Bld 125 (*)    Creatinine, Ser 1.22 (*)    GFR, Estimated 44 (*)    All other components within normal limits  CBG MONITORING, ED - Abnormal; Notable for the following components:   Glucose-Capillary 110 (*)    All other components within normal limits  CBC WITH DIFFERENTIAL/PLATELET  TROPONIN I (HIGH SENSITIVITY)  TROPONIN I (HIGH SENSITIVITY)    EKG EKG Interpretation  Date/Time:  Thursday April 08 2021 13:13:33 EDT Ventricular Rate:  61 PR Interval:  204 QRS Duration: 99 QT Interval:  430 QTC Calculation: 434 R Axis:   32 Text Interpretation: Sinus rhythm Confirmed by Milton Ferguson [phone]) on 04/08/2021 2:17:47 PM  Radiology CT Head Wo Contrast  Result Date: 04/08/2021 CLINICAL DATA:  Head trauma, minor. Neck trauma. Additional history provided: Weakness, dizziness, frequent falls over the last week. EXAM: CT HEAD WITHOUT CONTRAST CT CERVICAL SPINE WITHOUT CONTRAST TECHNIQUE: Multidetector CT imaging of the head and cervical spine was performed following the standard protocol without intravenous contrast. Multiplanar CT image reconstructions of the cervical spine were also generated. COMPARISON:  Brain MRI 07/24/2019. FINDINGS: CT HEAD FINDINGS Brain:  Mild-to-moderate generalized cerebral atrophy. Comparatively mild cerebellar atrophy. Mild patchy and ill-defined hypoattenuation within the cerebral white matter, nonspecific but compatible with chronic small vessel ischemic disease. CSF density prominence overlying the anterior left temporal lobe measuring 2.8 x 2.2 cm in transaxial dimensions, consistent with incidental arachnoid cyst. There is no acute intracranial hemorrhage. No demarcated cortical infarct. No extra-axial fluid collection. No evidence of an intracranial mass. No midline shift. Vascular: No hyperdense vessel. Atherosclerotic calcifications. Skull: Normal. Negative for fracture or focal lesion. Sinuses/Orbits: Visualized orbits show no acute finding. No significant paranasal sinus disease at the imaged levels. CT CERVICAL SPINE FINDINGS Alignment: Mild cervical levocurvature. Straightening of the expected cervical lordosis. No significant spondylolisthesis. Skull base and vertebrae: The basion-dental and atlanto-dental intervals are maintained.No evidence of acute fracture to the cervical spine. Soft tissues and spinal canal: No prevertebral fluid or swelling. No visible canal hematoma. Disc levels: Cervical spondylosis with multilevel disc space narrowing, disc bulges, central disc protrusions, posterior disc osteophytes, endplate spurring, uncovertebral hypertrophy and facet arthrosis. Multilevel spinal canal stenosis. Most notably, a C6-C7 posterior disc osteophyte complex contributes to suspected moderate spinal canal stenosis. Multilevel bony neural foraminal narrowing. Upper chest: No consolidation within the imaged lung apices. No visible pneumothorax. IMPRESSION: CT head: 1. No evidence of acute intracranial abnormality. 2. Mild chronic small vessel ischemic changes within the cerebral white matter. 3. Mild-to-moderate generalized cerebral atrophy. Comparatively mild cerebellar atrophy. CT cervical spine: 1. No evidence of acute fracture  to the cervical spine. 2. Nonspecific straightening of the expected cervical lordosis. 3. Mild  cervical levocurvature. 4. Cervical spondylosis, as described. Electronically Signed   By: Kellie Simmering DO   On: 04/08/2021 14:06   CT Cervical Spine Wo Contrast  Result Date: 04/08/2021 CLINICAL DATA:  Head trauma, minor. Neck trauma. Additional history provided: Weakness, dizziness, frequent falls over the last week. EXAM: CT HEAD WITHOUT CONTRAST CT CERVICAL SPINE WITHOUT CONTRAST TECHNIQUE: Multidetector CT imaging of the head and cervical spine was performed following the standard protocol without intravenous contrast. Multiplanar CT image reconstructions of the cervical spine were also generated. COMPARISON:  Brain MRI 07/24/2019. FINDINGS: CT HEAD FINDINGS Brain: Mild-to-moderate generalized cerebral atrophy. Comparatively mild cerebellar atrophy. Mild patchy and ill-defined hypoattenuation within the cerebral white matter, nonspecific but compatible with chronic small vessel ischemic disease. CSF density prominence overlying the anterior left temporal lobe measuring 2.8 x 2.2 cm in transaxial dimensions, consistent with incidental arachnoid cyst. There is no acute intracranial hemorrhage. No demarcated cortical infarct. No extra-axial fluid collection. No evidence of an intracranial mass. No midline shift. Vascular: No hyperdense vessel. Atherosclerotic calcifications. Skull: Normal. Negative for fracture or focal lesion. Sinuses/Orbits: Visualized orbits show no acute finding. No significant paranasal sinus disease at the imaged levels. CT CERVICAL SPINE FINDINGS Alignment: Mild cervical levocurvature. Straightening of the expected cervical lordosis. No significant spondylolisthesis. Skull base and vertebrae: The basion-dental and atlanto-dental intervals are maintained.No evidence of acute fracture to the cervical spine. Soft tissues and spinal canal: No prevertebral fluid or swelling. No visible canal  hematoma. Disc levels: Cervical spondylosis with multilevel disc space narrowing, disc bulges, central disc protrusions, posterior disc osteophytes, endplate spurring, uncovertebral hypertrophy and facet arthrosis. Multilevel spinal canal stenosis. Most notably, a C6-C7 posterior disc osteophyte complex contributes to suspected moderate spinal canal stenosis. Multilevel bony neural foraminal narrowing. Upper chest: No consolidation within the imaged lung apices. No visible pneumothorax. IMPRESSION: CT head: 1. No evidence of acute intracranial abnormality. 2. Mild chronic small vessel ischemic changes within the cerebral white matter. 3. Mild-to-moderate generalized cerebral atrophy. Comparatively mild cerebellar atrophy. CT cervical spine: 1. No evidence of acute fracture to the cervical spine. 2. Nonspecific straightening of the expected cervical lordosis. 3. Mild cervical levocurvature. 4. Cervical spondylosis, as described. Electronically Signed   By: Kellie Simmering DO   On: 04/08/2021 14:06   DG Chest Port 1 View  Result Date: 04/08/2021 CLINICAL DATA:  Dizziness and weakness. EXAM: PORTABLE CHEST 1 VIEW COMPARISON:  None. FINDINGS: The cardiac silhouette, mediastinal and hilar contours are within normal limits given the AP projection, portable technique and the patient's age. No acute pulmonary findings are identified. No pleural effusions or pulmonary lesions. The bony thorax is intact. IMPRESSION: No acute cardiopulmonary findings. Electronically Signed   By: Marijo Sanes M.D.   On: 04/08/2021 13:44    Procedures Procedures   Medications Ordered in ED Medications  sodium chloride 0.9 % bolus 1,000 mL (1,000 mLs Intravenous New Bag/Given 04/08/21 1311)  meclizine (ANTIVERT) tablet 25 mg (25 mg Oral Given 04/08/21 1311)  ondansetron (ZOFRAN) injection 4 mg (4 mg Intravenous Given 04/08/21 1312)    ED Course  I have reviewed the triage vital signs and the nursing notes.  Pertinent labs & imaging  results that were available during my care of the patient were reviewed by me and considered in my medical decision making (see chart for details). Patient with vertigo symptoms.  Patient improved with Antivert.  Labs and CT scans were unremarkable   MDM Rules/Calculators/A&P  Patient with vertigo.  She is given Antivert and Zofran and will follow up with her PCP Final Clinical Impression(s) / ED Diagnoses Final diagnoses:  Vertigo    Rx / DC Orders ED Discharge Orders          Ordered    meclizine (ANTIVERT) 25 MG tablet  3 times daily PRN        04/08/21 1500    ondansetron (ZOFRAN ODT) 4 MG disintegrating tablet        04/08/21 1500             Milton Ferguson, MD 04/11/21 1648

## 2021-04-08 NOTE — ED Notes (Signed)
[facility] called to transport pt home.

## 2021-04-12 ENCOUNTER — Telehealth: Payer: Self-pay | Admitting: Podiatry

## 2021-04-12 NOTE — Telephone Encounter (Signed)
error 

## 2021-04-27 ENCOUNTER — Telehealth: Payer: Self-pay | Admitting: Podiatry

## 2021-04-27 NOTE — Telephone Encounter (Signed)
Pts caregiver Charm Rings called asking about pts diabetic shoes. She said pt is telling her she is tripping over her current shoes. She said she has left several messages and no one has called her back.  I explained that we are waiting on the shoes to ship and it has been delayed due to manufacturer which is out of our control. I also apologized but let her know that I have not received any messages so I am not sure where she left them but I have returned all my calls.

## 2021-05-18 ENCOUNTER — Emergency Department (HOSPITAL_COMMUNITY): Payer: Medicare Other

## 2021-05-18 ENCOUNTER — Emergency Department (HOSPITAL_COMMUNITY)
Admission: EM | Admit: 2021-05-18 | Discharge: 2021-05-19 | Disposition: A | Discharge status: Home / Self Care | Payer: Medicare Other | Attending: Emergency Medicine | Admitting: Emergency Medicine

## 2021-05-18 DIAGNOSIS — M25561 Pain in right knee: Secondary | ICD-10-CM | POA: Diagnosis not present

## 2021-05-18 DIAGNOSIS — W19XXXA Unspecified fall, initial encounter: Secondary | ICD-10-CM

## 2021-05-18 DIAGNOSIS — Y92129 Unspecified place in nursing home as the place of occurrence of the external cause: Secondary | ICD-10-CM | POA: Diagnosis not present

## 2021-05-18 DIAGNOSIS — Y9301 Activity, walking, marching and hiking: Secondary | ICD-10-CM | POA: Insufficient documentation

## 2021-05-18 DIAGNOSIS — E119 Type 2 diabetes mellitus without complications: Secondary | ICD-10-CM | POA: Insufficient documentation

## 2021-05-18 DIAGNOSIS — Z85828 Personal history of other malignant neoplasm of skin: Secondary | ICD-10-CM | POA: Insufficient documentation

## 2021-05-18 DIAGNOSIS — Z79899 Other long term (current) drug therapy: Secondary | ICD-10-CM | POA: Diagnosis not present

## 2021-05-18 DIAGNOSIS — W07XXXA Fall from chair, initial encounter: Secondary | ICD-10-CM | POA: Diagnosis not present

## 2021-05-18 DIAGNOSIS — Z7982 Long term (current) use of aspirin: Secondary | ICD-10-CM | POA: Insufficient documentation

## 2021-05-18 DIAGNOSIS — Z853 Personal history of malignant neoplasm of breast: Secondary | ICD-10-CM | POA: Insufficient documentation

## 2021-05-18 DIAGNOSIS — I10 Essential (primary) hypertension: Secondary | ICD-10-CM | POA: Diagnosis not present

## 2021-05-18 DIAGNOSIS — S8001XA Contusion of right knee, initial encounter: Secondary | ICD-10-CM | POA: Diagnosis not present

## 2021-05-18 DIAGNOSIS — S8991XA Unspecified injury of right lower leg, initial encounter: Secondary | ICD-10-CM | POA: Diagnosis present

## 2021-05-18 NOTE — ED Provider Notes (Signed)
[facility] DEPT Provider Note   CSN: [id number] Arrival date & time: 05/18/21  1510     History Chief Complaint  Patient presents with   Fall    Lorraine Murphy is a 86 y.o. female.  HPI Patient reports that she uses a walker typically.  She reports she does have problems with frequent falls.  She had gone to a meeting in the assisted living and was trying to get into a chair from the side with arms.  She reports that she started to descend in the chair she lost her balance and ended up falling onto her buttocks and then listing slightly to the side and bumping her head on a table and then lying on the floor till somebody could help her get back up.  She reports she did not have loss of consciousness.  She did not have palpitations, chest pain or shortness of breath.  Patient denies she felt dizzy.  She has no headache.  She reports she lightly tapped her head on a table as she was falling.  She is not anticoagulated.  She denies any visual changes weakness numbness or tingling.  No neck pain.  She reports her only pain is a little bit in the right knee.    Past Medical History:  Diagnosis Date   AKI (acute kidney injury) ([place]) 03/07/2017   Anxiety    Arthritis    Bilateral cataracts    no surgery   Breast cancer ([place]) 02/29/12   right breast lumpectomy=invasive ductal cs ,ER?PR=positive her2 neu=neg   Cancer (HCC)    Colon polyp    Depression    Diabetes mellitus    diet contolled   GERD (gastroesophageal reflux disease)    H/O bone density study    H/O colonoscopy 2012   Hematuria    Hyperlipidemia    Hypertension    Incontinence    Obesity    Osteoarthritis    Schizophrenia ([place]) 89 age   paranoid schzophrenia   Skin cancer    SUI (stress urinary incontinence, female)    Wears glasses     Patient Active Problem List   Diagnosis Date Noted   Schizophrenia ([place]) 03/07/2017   Acute lower UTI 03/07/2017   AKI (acute kidney injury) ([place])  03/07/2017   Acute encephalopathy 03/07/2017   Hypokalemia 03/07/2017   Essential hypertension 03/07/2017   UTI (urinary tract infection) 03/07/2017   Postmenopausal estrogen deficiency 03/26/2014   Hypercalcemia 08/20/2013   Skin cancer    Anxiety    Malignant neoplasm of upper-outer quadrant of right breast in female, estrogen receptor positive ([place]) 02/16/2012    Past Surgical History:  Procedure Laterality Date   APPENDECTOMY     BREAST SURGERY     right breast lumpectomy   COLONOSCOPY     SPIDER VEIN INJECTION     TONSILECTOMY, ADENOIDECTOMY, BILATERAL MYRINGOTOMY AND TUBES     TONSILLECTOMY     TUBAL LIGATION       OB History     Gravida  2   Para  2   Term      Preterm      AB      Living         SAB      IAB      Ectopic      Multiple      Live Births           Obstetric Comments  Menarche age 62, HRT  10-15 years,         Family History  Problem Relation Age of Onset   Lung cancer Father    Other Maternal Aunt        brain tumor   Other Paternal Aunt        brain tumor   Skin cancer Brother    Dementia Neg Hx        not that she knows of    Social History   Tobacco Use   Smoking status: Never   Smokeless tobacco: Never  Substance Use Topics   Alcohol use: Yes    Comment: socially    Drug use: No    Home Medications Prior to Admission medications   Medication Sig Start Date End Date Taking? Authorizing Provider  amLODipine (NORVASC) 5 MG tablet Take 5 mg by mouth daily.   Yes [provider]  aspirin 81 MG tablet Take 81 mg by mouth daily.   Yes [provider]  atorvastatin (LIPITOR) 40 MG tablet Take 40 mg by mouth daily.   Yes [provider]  buPROPion (WELLBUTRIN XL) 300 MG 24 hr tablet Take 300 mg by mouth daily.    Yes [provider]  haloperidol decanoate (HALDOL DECANOATE) 100 MG/ML injection Inject 100 mg into the muscle every 30 (thirty) days.   Yes [provider]   meclizine (ANTIVERT) 25 MG tablet Take 1 tablet (25 mg total) by mouth 3 (three) times daily as needed for dizziness. 04/08/21  Yes Milton Ferguson, MD  meloxicam (MOBIC) 15 MG tablet Take 15 mg by mouth daily.   Yes [provider]  naproxen sodium (ALEVE) 220 MG tablet Take 220-440 mg by mouth 2 (two) times daily as needed (for pain).   Yes [provider]  OXYGEN Inhale 2 L/min into the lungs at bedtime.   Yes [provider]  REFRESH TEARS 0.5 % SOLN Place 1 drop into both eyes 3 (three) times daily as needed (for dryness).   Yes [provider]  sertraline (ZOLOFT) 50 MG tablet Take 50 mg by mouth daily.   Yes [provider]  vitamin B-12 (CYANOCOBALAMIN) 50 MCG tablet Take 50 mcg by mouth daily.   Yes [provider]  anastrozole (ARIMIDEX) 1 MG tablet TAKE 1 TABLET (1 MG TOTAL) BY MOUTH DAILY. Patient not taking: No sig reported 12/07/15   Magrinat, Virgie Dad, MD  ondansetron (ZOFRAN ODT) 4 MG disintegrating tablet 27m ODT q4 hours prn nausea/vomit Patient taking differently: Take 4 mg by mouth every 4 (four) hours as needed for vomiting or nausea (dissolve orally). 04/08/21   ZMilton Ferguson MD    Allergies    Sertraline  Review of Systems   Review of Systems 10 systems reviewed and negative except as per HPI Physical Exam Updated Vital Signs BP (!) 157/85   Pulse 67   Temp 98.2 F (36.8 C) (Oral)   Resp 18   Ht _0  (1.651 m)   Wt 110.2 kg   SpO2 97%   BMI 40.44 kg/m   Physical Exam Constitutional:      Comments: Alert nontoxic.  Interactive.  No signs of confusion.  No distress.  HENT:     Head: Normocephalic and atraumatic.     Right Ear: Tympanic membrane normal.     Left Ear: Tympanic membrane normal.     Ears:     Comments: Both ear canals clear.  TMs normal.    Mouth/Throat:  Mouth: Mucous membranes are moist.     Pharynx: Oropharynx is clear.  Eyes:     Extraocular Movements: Extraocular movements  intact.     Pupils: Pupils are equal, round, and reactive to light.  Neck:     Comments: No midline C-spine tenderness.  Anterior neck soft and supple. Cardiovascular:     Rate and Rhythm: Normal rate and regular rhythm.  Pulmonary:     Effort: Pulmonary effort is normal.     Breath sounds: Normal breath sounds.  Abdominal:     General: There is no distension.     Palpations: Abdomen is soft.     Tenderness: There is no abdominal tenderness. There is no guarding.  Musculoskeletal:     Comments: No extremity deformities.  Normal range of motion and use of the upper extremities.  Patient can grip both handrails and pull her self forward sitting in the stretcher without pain.  No midline spine tenderness.  Endorses some tenderness to palpation over the anterior and joint line spaces on the right knee.  No effusion or deformity.  I can put the full knee through range of motion with good flexion of the hip knee and ankle patient can push against resistance.  Normal range of motion of the left lower extremity full flexion at hip knee and ankle patient can push against resistance.  Skin:    General: Skin is warm and dry.  Neurological:     General: No focal deficit present.     Mental Status: She is oriented to person, place, and time.     Cranial Nerves: No cranial nerve deficit.     Sensory: No sensory deficit.     Motor: No weakness.     Coordination: Coordination normal.  Psychiatric:        Mood and Affect: Mood normal.    ED Results / Procedures / Treatments   Labs (all labs ordered are listed, but only abnormal results are displayed) Labs Reviewed - No data to display  EKG None  Radiology No results found.  Procedures Procedures   Medications Ordered in ED Medications - No data to display  ED Course  I have reviewed the triage vital signs and the nursing notes.  Pertinent labs & imaging results that were available during my care of the patient were reviewed by me and  considered in my medical decision making (see chart for details).    MDM Rules/Calculators/A&P                           Patient reports she has had problems with frequent falls.  She reports that she starts to try to sit or do position changes in a certain level her knees get weak and she loses her balance.  She reports this is not a new problem.  She tries to use her walker to maintain her steadiness and help with transitions.  Neurologic exam does not show any focal weakness.  No suggestion of stroke at this time.  No suggestion of significant head injury.  Knee x-ray obtained and negative for any acute findings.  Physical exam does not show any effusion at the knee.  No instability.  Will recommend symptomatic treatment with Ace wrap when standing and icing and elevating. Final Clinical Impression(s) / ED Diagnoses Final diagnoses:  Fall, initial encounter  Contusion of right knee, initial encounter    Rx / DC Orders ED Discharge Orders  None        Charlesetta Shanks, MD 05/18/21 [id number]

## 2021-05-18 NOTE — Discharge Instructions (Addendum)
1.  You appear to have a minor strain of your right knee and a bruise.  X-rays do not show any evidence of any fracture.  You are able to move your knee well.  At this time, there is not a significant mount of swelling.  You are advised to try to elevate the leg is much as possible.  Apply a well wrapped ice pack for about 20 minutes every 2 hours.  When you will be up standing or moving apply Ace wrap as done in the emergency department. 2.  Due to frequent falls it is very important that you are careful with position changes and transitions.  Continue to work with your care providers at your assisted living for additional accommodations for physical therapy, strengthening and evaluation of frequent falls with problems with balance. 3.  You are well in appearance today.  Return immediately if you feel you are ill with fevers, chills, new or worsening pain or other concerning symptoms.

## 2021-05-18 NOTE — ED Triage Notes (Deleted)
Ems brings pt in from [facility] for a fall. Pt states her kees got shaky and she fell when walking out of the elevator. Pt has no complaints from the fall. Denies hitting head. Pt only states she wants her ears checks out.

## 2021-05-18 NOTE — ED Triage Notes (Signed)
Ems brings pt in from [facility] for a fall. Pt states her kees got shaky and she fell when walking out of the elevator. Pt has no complaints from the fall. Denies hitting head. Pt only states she wants her ears checks out.

## 2021-05-18 NOTE — ED Notes (Signed)
ptar called 

## 2021-05-30 ENCOUNTER — Emergency Department (HOSPITAL_COMMUNITY): Payer: Medicare Other

## 2021-05-30 ENCOUNTER — Emergency Department (HOSPITAL_COMMUNITY)
Admission: EM | Admit: 2021-05-30 | Discharge: 2021-05-30 | Disposition: A | Discharge status: Home / Self Care | Payer: Medicare Other | Attending: Emergency Medicine | Admitting: Emergency Medicine

## 2021-05-30 ENCOUNTER — Encounter (HOSPITAL_COMMUNITY): Payer: Self-pay

## 2021-05-30 DIAGNOSIS — Z85828 Personal history of other malignant neoplasm of skin: Secondary | ICD-10-CM | POA: Insufficient documentation

## 2021-05-30 DIAGNOSIS — Z853 Personal history of malignant neoplasm of breast: Secondary | ICD-10-CM | POA: Diagnosis not present

## 2021-05-30 DIAGNOSIS — Z79899 Other long term (current) drug therapy: Secondary | ICD-10-CM | POA: Insufficient documentation

## 2021-05-30 DIAGNOSIS — Z7982 Long term (current) use of aspirin: Secondary | ICD-10-CM | POA: Diagnosis not present

## 2021-05-30 DIAGNOSIS — S6992XA Unspecified injury of left wrist, hand and finger(s), initial encounter: Secondary | ICD-10-CM

## 2021-05-30 DIAGNOSIS — E785 Hyperlipidemia, unspecified: Secondary | ICD-10-CM | POA: Insufficient documentation

## 2021-05-30 DIAGNOSIS — I10 Essential (primary) hypertension: Secondary | ICD-10-CM | POA: Diagnosis not present

## 2021-05-30 DIAGNOSIS — S0990XA Unspecified injury of head, initial encounter: Secondary | ICD-10-CM

## 2021-05-30 DIAGNOSIS — S8992XA Unspecified injury of left lower leg, initial encounter: Secondary | ICD-10-CM | POA: Insufficient documentation

## 2021-05-30 DIAGNOSIS — E1169 Type 2 diabetes mellitus with other specified complication: Secondary | ICD-10-CM | POA: Insufficient documentation

## 2021-05-30 DIAGNOSIS — Y92003 Bedroom of unspecified non-institutional (private) residence as the place of occurrence of the external cause: Secondary | ICD-10-CM | POA: Insufficient documentation

## 2021-05-30 DIAGNOSIS — E1136 Type 2 diabetes mellitus with diabetic cataract: Secondary | ICD-10-CM | POA: Diagnosis not present

## 2021-05-30 DIAGNOSIS — S0101XA Laceration without foreign body of scalp, initial encounter: Secondary | ICD-10-CM | POA: Insufficient documentation

## 2021-05-30 DIAGNOSIS — W01198A Fall on same level from slipping, tripping and stumbling with subsequent striking against other object, initial encounter: Secondary | ICD-10-CM | POA: Insufficient documentation

## 2021-05-30 DIAGNOSIS — S0003XA Contusion of scalp, initial encounter: Secondary | ICD-10-CM

## 2021-05-30 DIAGNOSIS — Z23 Encounter for immunization: Secondary | ICD-10-CM | POA: Insufficient documentation

## 2021-05-30 DIAGNOSIS — W19XXXA Unspecified fall, initial encounter: Secondary | ICD-10-CM

## 2021-05-30 MED ORDER — BACITRACIN ZINC 500 UNIT/GM EX OINT
TOPICAL_OINTMENT | Freq: Once | CUTANEOUS | Status: AC
  Administered 2021-05-30: 1 via TOPICAL
  Filled 2021-05-30: qty 0.9

## 2021-05-30 MED ORDER — LIDOCAINE-PRILOCAINE 2.5-2.5 % EX CREA
TOPICAL_CREAM | Freq: Once | CUTANEOUS | Status: DC
  Filled 2021-05-30: qty 5

## 2021-05-30 MED ORDER — TETANUS-DIPHTH-ACELL PERTUSSIS 5-2.5-18.5 LF-MCG/0.5 IM SUSY
0.5000 mL | PREFILLED_SYRINGE | Freq: Once | INTRAMUSCULAR | Status: AC
  Administered 2021-05-30: 0.5 mL via INTRAMUSCULAR
  Filled 2021-05-30: qty 0.5

## 2021-05-30 MED ORDER — BACITRACIN-NEOMYCIN-POLYMYXIN 400-5-5000 EX OINT: TOPICAL_OINTMENT | Freq: Once | CUTANEOUS | Status: DC

## 2021-05-30 NOTE — ED Notes (Signed)
PTAR called for transport.  

## 2021-05-30 NOTE — ED Notes (Signed)
Pt transported to xray 

## 2021-05-30 NOTE — Discharge Instructions (Addendum)
Keep the wound clean and as dry as possible. Do not immerse or soak the wound in water. This means no swimming, hot tubs, baths or hot tubs until the staples are removed. vAfter this time, showering or rinsing is recommended, rather than bathing. the first day, gently cleanse the wound with soap and water. Cleansing twice a day prevents buildup of debris and will result in easier staple removal. Have the wound reevaluated for potential staple removal on the Scalp in 10-14 days    Based on the events which brought you to the ER today, it is possible that you may have a concussion. A concussion occurs when there is a blow to the head or body, with enough force to shake the brain and disrupt how the brain functions. You may experience symptoms such as headaches, sensitivity to light/noise, dizziness, cognitive slowing, difficulty concentrating / remembering, trouble sleeping and drowsiness. These symptoms may last anywhere from hours/days to potentially weeks/months. While these symptoms are very frustrating and perhaps debilitating, it is important that you remember that they will improve over time. Everyone has a different rate of recovery; it is difficult to predict when your symptoms will resolve. In order to allow for your brain to heal after the injury, we recommend that you see your primary physician or a physician knowledgeable in concussion management. We also advise you to let your body and brain rest: avoid physical activities (sports, gym, and exercise) and reduce cognitive demands (reading, texting, TV watching, computer use, video games, etc). School attendance, after-school activities and work may need to be modified to avoid increasing symptoms. We recommend against driving until until all symptoms have resolved. You should take '650mg'$  of Acetaminophen (Tylenol) every 4 hours as needed for pain control; however, taking anti-inflammatory medication (Motrin/Advil/Ibuprofen) is not advised. Come back to  the ER right away if you are having repeated episodes of vomiting, severe/worsening headache/dizziness or any other symptom that alarms you. We recommended that someone stay with you for the next 24 hours to monitor for these worrisome symptoms.

## 2021-05-30 NOTE — ED Triage Notes (Signed)
Pt BIB [facility] from [facility] for unwitnessed mechanical fall. Pt states she got tripped up between door and walker. Hit head, denies LOC. No blood thinners. Visible swelling and laceration to back of head. Swelling to left hand, EMS applied ice pack. ROM and sensation normal. Denies neck and back pain, endorses some right hip and left shoulder pain after moving. VSS. Reports a witnessed fall yesterday without head lac. Denies NV. A&Ox4.  BP 142 palp HR 86 RR 20 SpO2 95% RA CBG 119

## 2021-05-30 NOTE — ED Notes (Signed)
PTAR on unit to transfer pt back to facility. No s/s of acute distress noted at discharge.

## 2021-05-30 NOTE — ED Provider Notes (Signed)
[facility] DEPT Provider Note   CSN: [id number] Arrival date & time: 05/30/21  1408     History Chief Complaint  Patient presents with   Fall   Laceration    Lorraine Murphy is a 86 y.o. female.  86 year old female with history as below presenting to the ER secondary to fall.  Reports is attempting to open the door to her bedroom and fell backwards.  Landed on her butt and hit the back of her head on the floor.  No LOC, no thinners.  She also hit her left hand that she was falling as she was attempting to grab a chair as she fell.  She was ambulatory after the event.  No headache, no neck pain, no back pain.  No pain to lower extremities.  No belly pain or chest pain.  No diaphoresis.  Normal state of health prior to these events.  No lightheadedness or dizziness.  Good compliance with all medications.  She is unsure of last tetanus shot  The history is provided by the patient. No language interpreter was used.  Fall Pertinent negatives include no chest pain, no abdominal pain, no headaches and no shortness of breath.  Laceration Associated symptoms: no fever and no rash       Past Medical History:  Diagnosis Date   AKI (acute kidney injury) ([place]) 03/07/2017   Anxiety    Arthritis    Bilateral cataracts    no surgery   Breast cancer ([place]) 02/29/12   right breast lumpectomy=invasive ductal cs ,ER?PR=positive her2 neu=neg   Cancer ([facility])    Colon polyp    Depression    Diabetes mellitus    diet contolled   GERD (gastroesophageal reflux disease)    H/O bone density study    H/O colonoscopy 2012   Hematuria    Hyperlipidemia    Hypertension    Incontinence    Obesity    Osteoarthritis    Schizophrenia ([place]) 47 age   paranoid schzophrenia   Skin cancer    SUI (stress urinary incontinence, female)    Wears glasses     Patient Active Problem List   Diagnosis Date Noted   Schizophrenia ([place]) 03/07/2017   Acute lower UTI 03/07/2017   AKI  (acute kidney injury) ([place]) 03/07/2017   Acute encephalopathy 03/07/2017   Hypokalemia 03/07/2017   Essential hypertension 03/07/2017   UTI (urinary tract infection) 03/07/2017   Postmenopausal estrogen deficiency 03/26/2014   Hypercalcemia 08/20/2013   Skin cancer    Anxiety    Malignant neoplasm of upper-outer quadrant of right breast in female, estrogen receptor positive ([place]) 02/16/2012    Past Surgical History:  Procedure Laterality Date   APPENDECTOMY     BREAST SURGERY     right breast lumpectomy   COLONOSCOPY     SPIDER VEIN INJECTION     TONSILECTOMY, ADENOIDECTOMY, BILATERAL MYRINGOTOMY AND TUBES     TONSILLECTOMY     TUBAL LIGATION       OB History     Gravida  2   Para  2   Term      Preterm      AB      Living         SAB      IAB      Ectopic      Multiple      Live Births           Obstetric Comments  Menarche age 48,  HRT 10-15 years,         Family History  Problem Relation Age of Onset   Lung cancer Father    Other Maternal Aunt        brain tumor   Other Paternal Aunt        brain tumor   Skin cancer Brother    Dementia Neg Hx        not that she knows of    Social History   Tobacco Use   Smoking status: Never   Smokeless tobacco: Never  Substance Use Topics   Alcohol use: Yes    Comment: socially    Drug use: No    Home Medications Prior to Admission medications   Medication Sig Start Date End Date Taking? Authorizing Provider  amLODipine (NORVASC) 5 MG tablet Take 5 mg by mouth daily.    [provider]  anastrozole (ARIMIDEX) 1 MG tablet TAKE 1 TABLET (1 MG TOTAL) BY MOUTH DAILY. Patient not taking: No sig reported 12/07/15   Magrinat, Virgie Dad, MD  aspirin 81 MG tablet Take 81 mg by mouth daily.    [provider]  atorvastatin (LIPITOR) 40 MG tablet Take 40 mg by mouth daily.    [provider]  buPROPion (WELLBUTRIN XL) 300 MG 24 hr tablet Take 300 mg by mouth daily.      [provider]  haloperidol decanoate (HALDOL DECANOATE) 100 MG/ML injection Inject 100 mg into the muscle every 30 (thirty) days.    [provider]  meclizine (ANTIVERT) 25 MG tablet Take 1 tablet (25 mg total) by mouth 3 (three) times daily as needed for dizziness. 04/08/21   Milton Ferguson, MD  meloxicam (MOBIC) 15 MG tablet Take 15 mg by mouth daily.    [provider]  naproxen sodium (ALEVE) 220 MG tablet Take 220-440 mg by mouth 2 (two) times daily as needed (for pain).    [provider]  ondansetron (ZOFRAN ODT) 4 MG disintegrating tablet 93m ODT q4 hours prn nausea/vomit Patient taking differently: Take 4 mg by mouth every 4 (four) hours as needed for vomiting or nausea (dissolve orally). 04/08/21   ZMilton Ferguson MD  OXYGEN Inhale 2 L/min into the lungs at bedtime.    [provider]  REFRESH TEARS 0.5 % SOLN Place 1 drop into both eyes 3 (three) times daily as needed (for dryness).    [provider]  sertraline (ZOLOFT) 50 MG tablet Take 50 mg by mouth daily.    [provider]  vitamin B-12 (CYANOCOBALAMIN) 50 MCG tablet Take 50 mcg by mouth daily.    [provider]    Allergies    Sertraline  Review of Systems   Review of Systems  Constitutional:  Negative for chills and fever.  HENT:  Negative for facial swelling and trouble swallowing.   Eyes:  Negative for photophobia and visual disturbance.  Respiratory:  Negative for cough and shortness of breath.   Cardiovascular:  Negative for chest pain and palpitations.  Gastrointestinal:  Negative for abdominal pain, nausea and vomiting.  Endocrine: Negative for polydipsia and polyuria.  Genitourinary:  Negative for difficulty urinating and hematuria.  Musculoskeletal:  Negative for gait problem and joint swelling.  Skin:  Positive for wound. Negative for pallor and rash.  Neurological:  Negative for syncope and headaches.  Psychiatric/Behavioral:  Negative  for agitation and confusion.    Physical Exam Updated Vital Signs BP (!) 155/62   Pulse 67  Temp 97.9 F (36.6 C) (Oral)   Resp 18   Ht _0  (1.651 m)   Wt 110 kg   SpO2 99%   BMI 40.36 kg/m   Physical Exam Vitals and nursing note reviewed.  Constitutional:      General: She is not in acute distress.    Appearance: Normal appearance.  HENT:     Head: Normocephalic. No raccoon eyes, right periorbital erythema or left periorbital erythema.     Jaw: There is normal jaw occlusion.      Right Ear: External ear normal.     Left Ear: External ear normal.     Nose: Nose normal.     Mouth/Throat:     Mouth: Mucous membranes are moist.  Eyes:     General: No scleral icterus.       Right eye: No discharge.        Left eye: No discharge.  Cardiovascular:     Rate and Rhythm: Normal rate and regular rhythm.     Pulses: Normal pulses.     Heart sounds: Normal heart sounds.  Pulmonary:     Effort: Pulmonary effort is normal. No respiratory distress.     Breath sounds: Normal breath sounds.  Abdominal:     General: Abdomen is flat.     Tenderness: There is no abdominal tenderness.  Musculoskeletal:        General: Normal range of motion.       Hands:     Cervical back: Normal range of motion.     Right lower leg: No edema.     Left lower leg: No edema.     Comments: No midline spinous process tenderness upon direct palpation or percussion, no crepitus or stepoff.   Skin:    General: Skin is warm and dry.     Capillary Refill: Capillary refill takes less than 2 seconds.  Neurological:     Mental Status: She is alert and oriented to person, place, and time.     GCS: GCS eye subscore is 4. GCS verbal subscore is 5. GCS motor subscore is 6.     Cranial Nerves: Cranial nerves are intact.     Sensory: Sensation is intact.     Motor: Motor function is intact.     Coordination: Coordination is intact.  Psychiatric:        Mood and Affect: Mood normal.        Behavior:  Behavior normal.    ED Results / Procedures / Treatments   Labs (all labs ordered are listed, but only abnormal results are displayed) Labs Reviewed - No data to display  EKG None  Radiology DG Pelvis 1-2 Views  Result Date: 05/30/2021 CLINICAL DATA:  Fall. EXAM: PELVIS - 1-2 VIEW COMPARISON:  Right hip x-rays dated December 16, 2020. FINDINGS: There is no evidence of pelvic fracture or diastasis. No pelvic bone lesions are seen. IMPRESSION: Negative. Electronically Signed   By: Titus Dubin M.D.   On: 05/30/2021 16:10   CT HEAD WO CONTRAST (5MM)  Result Date: 05/30/2021 CLINICAL DATA:  Fall. EXAM: CT HEAD WITHOUT CONTRAST CT CERVICAL SPINE WITHOUT CONTRAST TECHNIQUE: Multidetector CT imaging of the head and cervical spine was performed following the standard protocol without intravenous contrast. Multiplanar CT image reconstructions of the cervical spine were also generated. COMPARISON:  CT head and cervical spine dated April 08, 2021. FINDINGS: CT HEAD FINDINGS Brain: No evidence of acute infarction, hemorrhage, hydrocephalus, extra-axial collection or mass lesion/mass effect.  Unchanged arachnoid cyst in the left middle cranial fossa. Stable atrophy and chronic microvascular ischemic changes. Vascular: Atherosclerotic vascular calcification of the carotid siphons. No hyperdense vessel. Skull: Normal. Negative for fracture or focal lesion. Sinuses/Orbits: No acute finding. Other: Small posterior scalp hematoma and laceration at the vertex. CT CERVICAL SPINE FINDINGS Alignment: Normal. Skull base and vertebrae: No acute fracture. No primary bone lesion or focal pathologic process. Soft tissues and spinal canal: No prevertebral fluid or swelling. No visible canal hematoma. Disc levels: Similar diffuse mild-to-moderate disc height loss and advanced facet uncovertebral hypertrophy throughout the cervical spine. Upper chest: Negative. Other: None. IMPRESSION: 1. No acute intracranial abnormality. Small  posterior scalp hematoma and laceration at the vertex. 2. No acute cervical spine fracture or traumatic listhesis. Electronically Signed   By: Titus Dubin M.D.   On: 05/30/2021 15:54   CT Cervical Spine Wo Contrast  Result Date: 05/30/2021 CLINICAL DATA:  Fall. EXAM: CT HEAD WITHOUT CONTRAST CT CERVICAL SPINE WITHOUT CONTRAST TECHNIQUE: Multidetector CT imaging of the head and cervical spine was performed following the standard protocol without intravenous contrast. Multiplanar CT image reconstructions of the cervical spine were also generated. COMPARISON:  CT head and cervical spine dated April 08, 2021. FINDINGS: CT HEAD FINDINGS Brain: No evidence of acute infarction, hemorrhage, hydrocephalus, extra-axial collection or mass lesion/mass effect. Unchanged arachnoid cyst in the left middle cranial fossa. Stable atrophy and chronic microvascular ischemic changes. Vascular: Atherosclerotic vascular calcification of the carotid siphons. No hyperdense vessel. Skull: Normal. Negative for fracture or focal lesion. Sinuses/Orbits: No acute finding. Other: Small posterior scalp hematoma and laceration at the vertex. CT CERVICAL SPINE FINDINGS Alignment: Normal. Skull base and vertebrae: No acute fracture. No primary bone lesion or focal pathologic process. Soft tissues and spinal canal: No prevertebral fluid or swelling. No visible canal hematoma. Disc levels: Similar diffuse mild-to-moderate disc height loss and advanced facet uncovertebral hypertrophy throughout the cervical spine. Upper chest: Negative. Other: None. IMPRESSION: 1. No acute intracranial abnormality. Small posterior scalp hematoma and laceration at the vertex. 2. No acute cervical spine fracture or traumatic listhesis. Electronically Signed   By: Titus Dubin M.D.   On: 05/30/2021 15:54   DG Hand 2 View Left  Result Date: 05/30/2021 CLINICAL DATA:  Fall. EXAM: LEFT HAND - 2 VIEW COMPARISON:  None. FINDINGS: No acute fracture or dislocation.  Osteoarthritis of the first [facility] joint and multiple IP joints. Osteopenia. Prominent dorsal hand soft tissue swelling. IMPRESSION: 1. Prominent dorsal hand soft tissue swelling. No acute osseous abnormality. Electronically Signed   By: Titus Dubin M.D.   On: 05/30/2021 16:12   DG Chest Portable 1 View  Result Date: 05/30/2021 CLINICAL DATA:  Fall. EXAM: PORTABLE CHEST 1 VIEW COMPARISON:  Chest x-ray dated April 08, 2021. FINDINGS: The heart size and mediastinal contours are within normal limits. Both lungs are clear. The visualized skeletal structures are unremarkable. IMPRESSION: No active disease. Electronically Signed   By: Titus Dubin M.D.   On: 05/30/2021 16:13    Procedures .Marland KitchenLaceration Repair  Date/Time: 05/30/2021 5:00 PM Performed by: Jeanell Sparrow, DO Authorized by: Jeanell Sparrow, DO   Consent:    Consent obtained:  Verbal   Consent given by:  Patient   Risks, benefits, and alternatives were discussed: yes     Risks discussed:  Infection, pain and need for additional repair   Alternatives discussed:  No treatment Universal protocol:    Immediately prior to procedure, a time out was called: yes  Patient identity confirmed:  Verbally with patient and arm band Anesthesia:    Anesthesia method:  None Laceration details:    Location:  Scalp   Scalp location:  Occipital   Length (cm):  2 Pre-procedure details:    Preparation:  Imaging obtained to evaluate for foreign bodies Exploration:    Hemostasis achieved with:  Direct pressure   Imaging obtained comment:  CT   Imaging outcome: foreign body not noted     Wound exploration: wound explored through full range of motion and entire depth of wound visualized     Contaminated: no   Treatment:    Area cleansed with:  Saline   Irrigation solution:  Sterile water   Irrigation volume:  50 cc   Irrigation method:  Syringe   Debridement:  None   Undermining:  None   Scar revision: no   Skin repair:    Repair method:   Staples   Number of staples:  4 Approximation:    Approximation:  Close Repair type:    Repair type:  Simple Post-procedure details:    Dressing:  Antibiotic ointment   Procedure completion:  Tolerated well, no immediate complications   Medications Ordered in ED Medications  Tdap (BOOSTRIX) injection 0.5 mL (0.5 mLs Intramuscular Given 05/30/21 1642)  bacitracin ointment (1 application Topical Given 05/30/21 1717)    ED Course  I have reviewed the triage vital signs and the nursing notes.  Pertinent labs & imaging results that were available during my care of the patient were reviewed by me and considered in my medical decision making (see chart for details).    MDM Rules/Calculators/A&P                           86 yo female with fall at nursing home. Favor mechanical fall after discussion with patient. No LOC. No thinners. She has lac to posterior portion of scalp, hematoma to left hand. Obtain imaging. Vital signs reviewed and are stable. Neuro exam is non-focal. Serious etiology considered.  XR and CT reviewed.   Pt with scalp laceration. This was irrigated copiously and repaired with staples.   Discussed wound care with the patient.  Advised her to f/u with PCP for wound evaluation / staple removal in 10-14 days.   Discussed concussion precautions given head injury. Discussed how to avoid future falls.   The patient improved significantly and was discharged in stable condition. Detailed discussions were had with the patient regarding current findings, and need for close f/u with PCP or on call doctor. The patient has been instructed to return immediately if the symptoms worsen in any way for re-evaluation. Patient verbalized understanding and is in agreement with current care plan. All questions answered prior to discharge.   Final Clinical Impression(s) / ED Diagnoses Final diagnoses:  Injury of head, initial encounter  Laceration of scalp, initial encounter  Injury  of left hand, initial encounter  Hematoma of scalp, initial encounter  Fall, initial encounter    Rx / DC Orders ED Discharge Orders     None        Jeanell Sparrow, DO 05/31/21 [id number]

## 2021-05-31 ENCOUNTER — Other Ambulatory Visit: Payer: Medicare Other

## 2021-06-10 ENCOUNTER — Telehealth: Payer: Self-pay | Admitting: Podiatry

## 2021-06-10 NOTE — Telephone Encounter (Signed)
Pts caregiver angela rudd left message stating pt has a conflict with the appt on 9.13, her family scheduled an appt for her as well and it will be 1.5 hr appt.  I returned call and left message for Mrs Gena Fray to call back to r/s appt to 9.22.2022 as that is when the authorization expires.

## 2021-06-15 ENCOUNTER — Other Ambulatory Visit: Payer: Medicare Other

## 2021-06-16 ENCOUNTER — Ambulatory Visit: Payer: Medicare Other | Admitting: Podiatry

## 2021-06-16 ENCOUNTER — Other Ambulatory Visit: Payer: Self-pay

## 2021-06-16 ENCOUNTER — Encounter: Payer: Self-pay | Admitting: Podiatry

## 2021-06-16 DIAGNOSIS — M21612 Bunion of left foot: Secondary | ICD-10-CM

## 2021-06-16 DIAGNOSIS — E119 Type 2 diabetes mellitus without complications: Secondary | ICD-10-CM | POA: Diagnosis not present

## 2021-06-16 DIAGNOSIS — Z87898 Personal history of other specified conditions: Secondary | ICD-10-CM | POA: Diagnosis not present

## 2021-06-16 DIAGNOSIS — L989 Disorder of the skin and subcutaneous tissue, unspecified: Secondary | ICD-10-CM | POA: Diagnosis not present

## 2021-06-16 DIAGNOSIS — M21611 Bunion of right foot: Secondary | ICD-10-CM

## 2021-06-16 NOTE — Progress Notes (Signed)
This patient returns to my office for at risk foot care.  This patient requires this care by a professional since this patient will be at risk due to having diet controlled diabetes.  This patient presents to the office for examination of callus under her big toe joint right foot. She says she has pain but no drainage noted from callus.  She presents to the office wearing a dispersion pad right foot. This patient presents for at risk foot care today.  General Appearance  Alert, conversant and in no acute stress.  Vascular  Dorsalis pedis and posterior tibial  pulses are palpable  bilaterally.  Capillary return is within normal limits  bilaterally. Temperature is within normal limits  bilaterally.  Neurologic  Senn-Weinstein monofilament wire test absent  bilaterally. Muscle power within normal limits bilaterally.  Nails Normal nails both feet . No evidence of bacterial infection or drainage bilaterally.  Orthopedic  No limitations of motion  feet .  No crepitus or effusions noted.  No bony pathology or digital deformities noted. Prominent tibial sesamoid right foot.    Skin  normotropic skin with porokeratosis noted  sib 1st MPJ  right foot.  No signs of infections or ulcers noted.     Onychomycosis  Pain in right toes  Pain in left toes  Consent was obtained for treatment procedures.   Debridement of pre-ulcerous callus right foot with # 15 blade.  Patient was given sheet for dispersion gel.   Return office visit   prn                  Told patient to return for periodic foot care and evaluation due to potential at risk complications.   Gardiner Barefoot DPM

## 2021-06-24 ENCOUNTER — Other Ambulatory Visit: Payer: Self-pay

## 2021-06-24 ENCOUNTER — Ambulatory Visit (INDEPENDENT_AMBULATORY_CARE_PROVIDER_SITE_OTHER): Payer: Medicare Other

## 2021-06-24 DIAGNOSIS — E119 Type 2 diabetes mellitus without complications: Secondary | ICD-10-CM

## 2021-06-24 DIAGNOSIS — M2011 Hallux valgus (acquired), right foot: Secondary | ICD-10-CM | POA: Diagnosis not present

## 2021-06-24 DIAGNOSIS — M2012 Hallux valgus (acquired), left foot: Secondary | ICD-10-CM | POA: Diagnosis not present

## 2021-06-24 NOTE — Progress Notes (Signed)
Patient in office today to pick-up diabetic shoes and custom inserts. Patient tried on the shoes with the custom inserts and was satisfied with the fit and feel of the shoe and inserts. Patient was educated on the break-in process and return policy at this time. Patient verbalized understanding using the teach back method. Advised patient to call the office with any questions, comments, or concerns.

## 2022-06-11 IMAGING — DX DG KNEE COMPLETE 4+V*R*
4 series · 4 of 4 positions shown · non-contrast
Comparison: None.

CLINICAL DATA: Fall today and knee.  Pain medially.

EXAM:
RIGHT KNEE - COMPLETE 4+ VIEW

[knee ap]
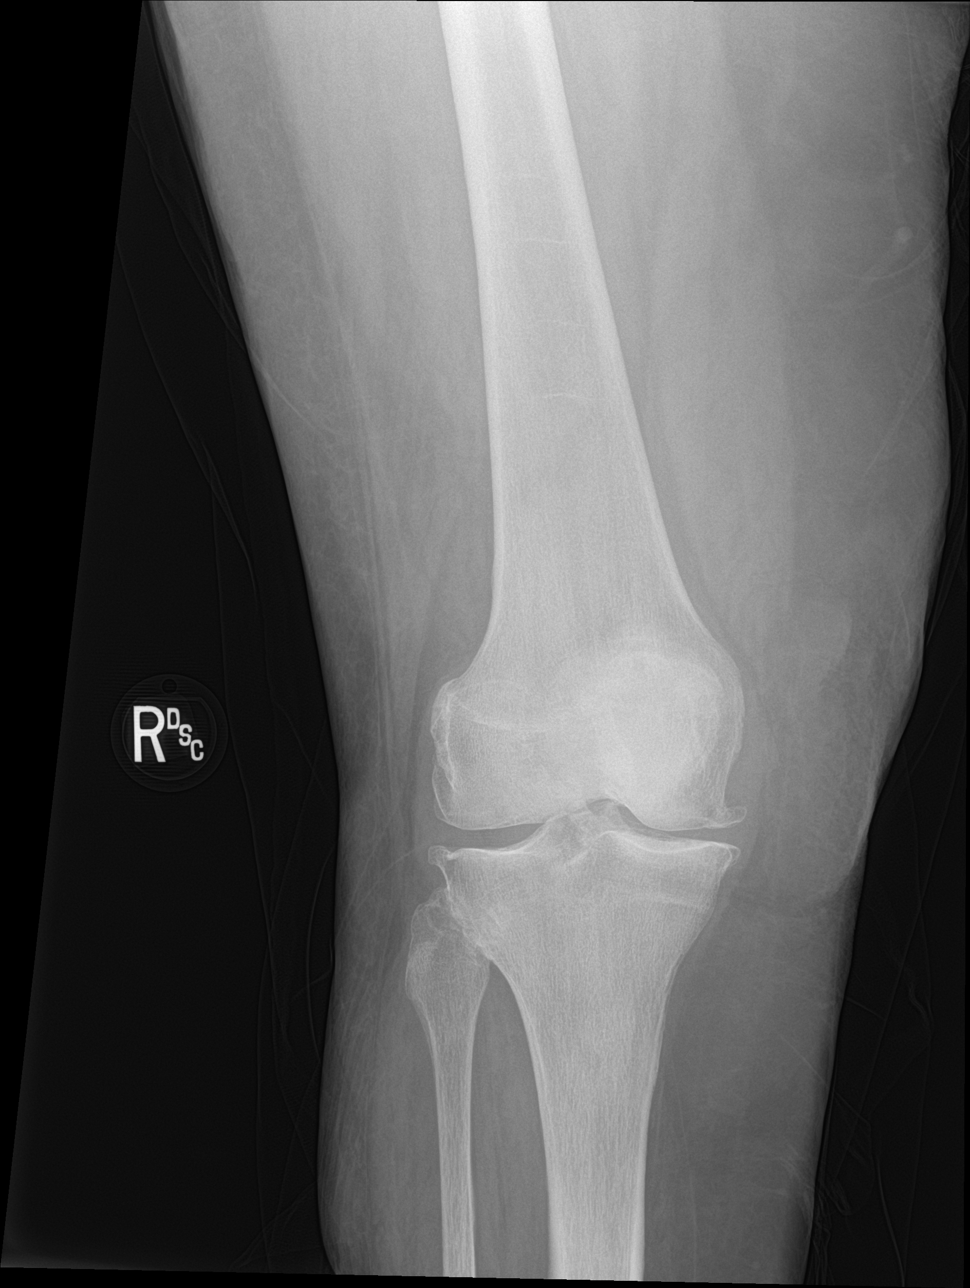

[knee lat]
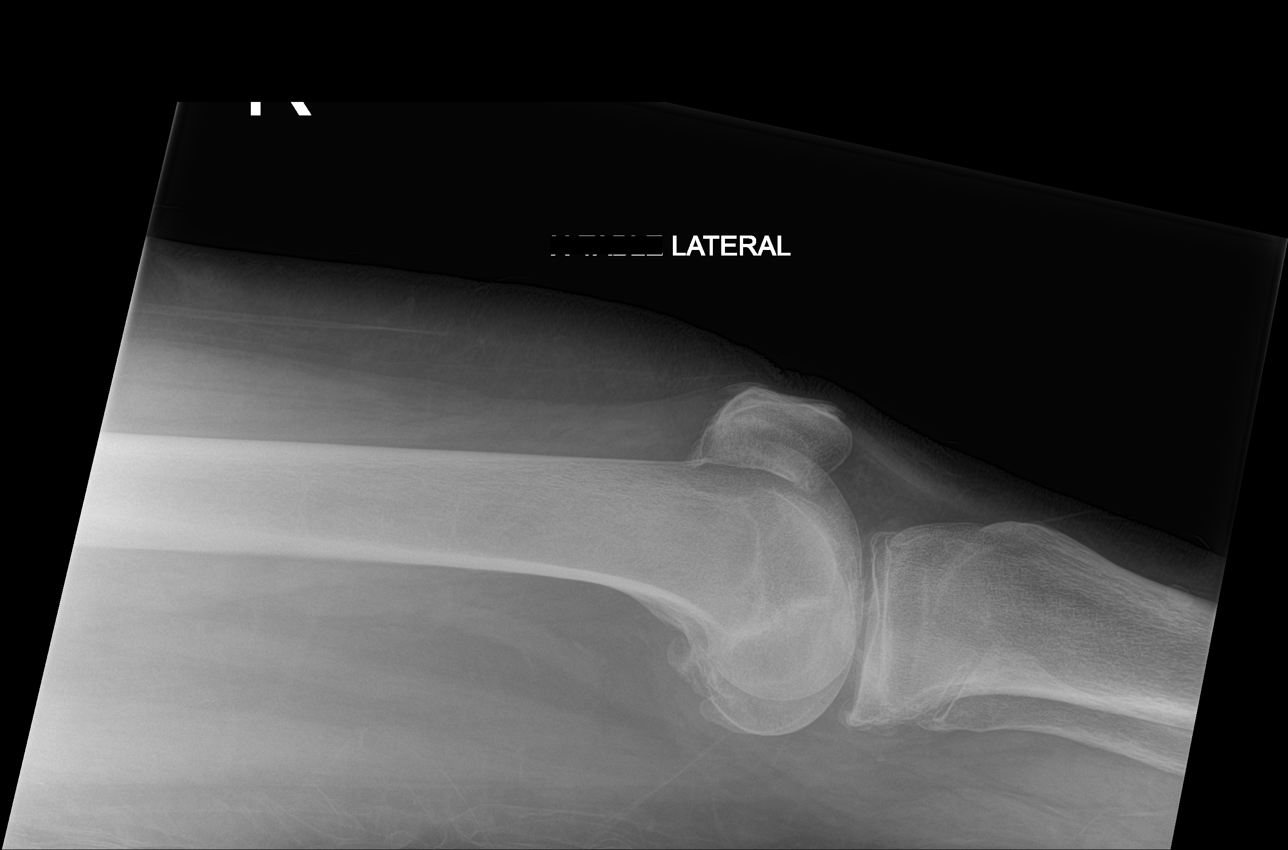

[knee obl (1 of 2)]
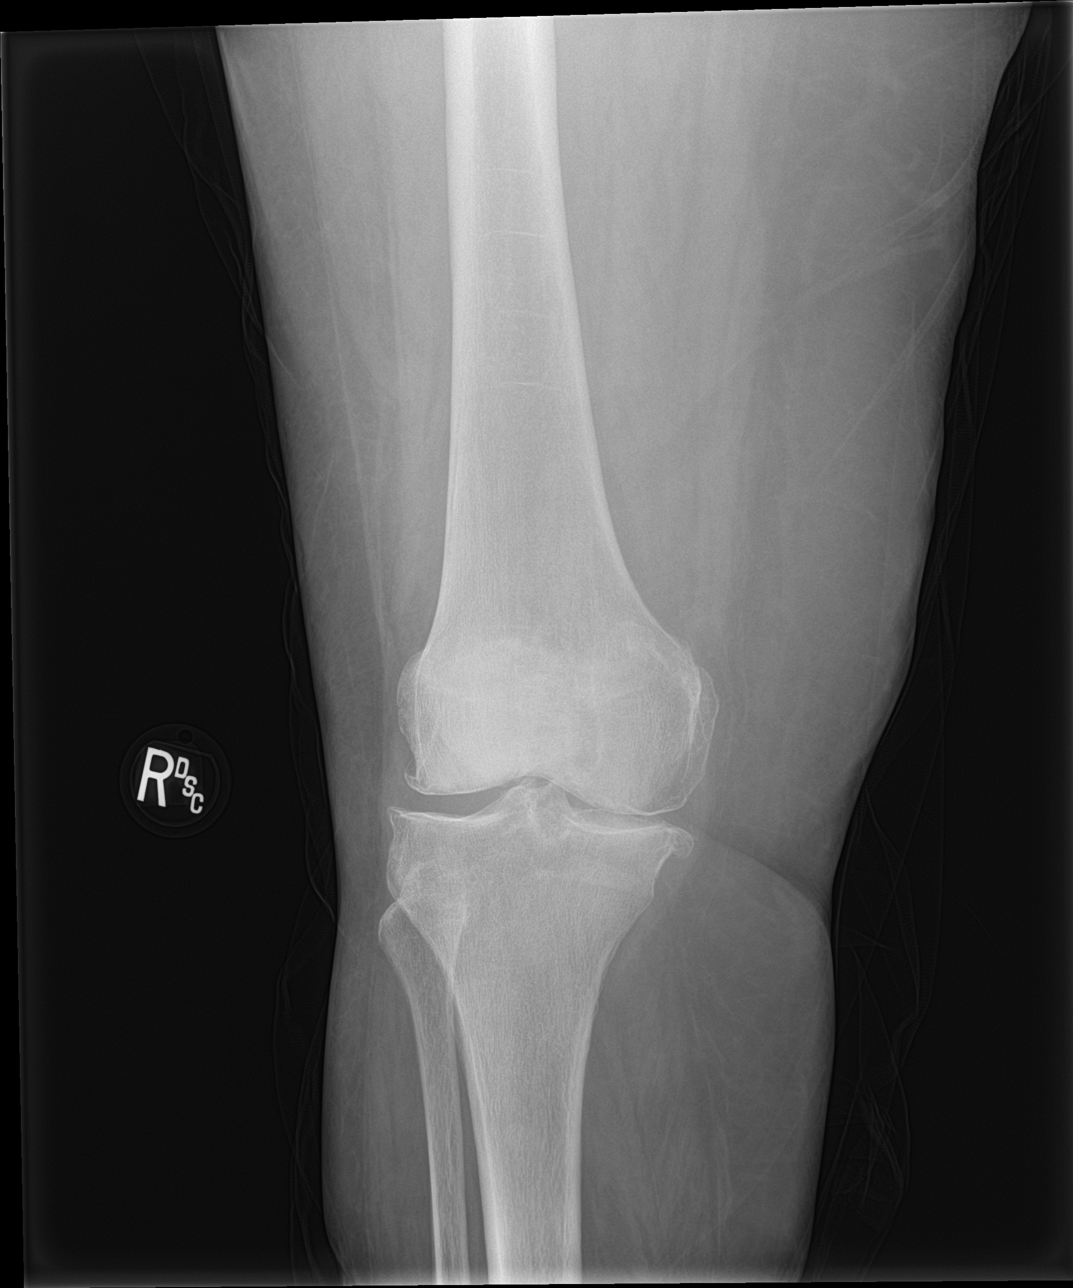

[knee obl (2 of 2)]
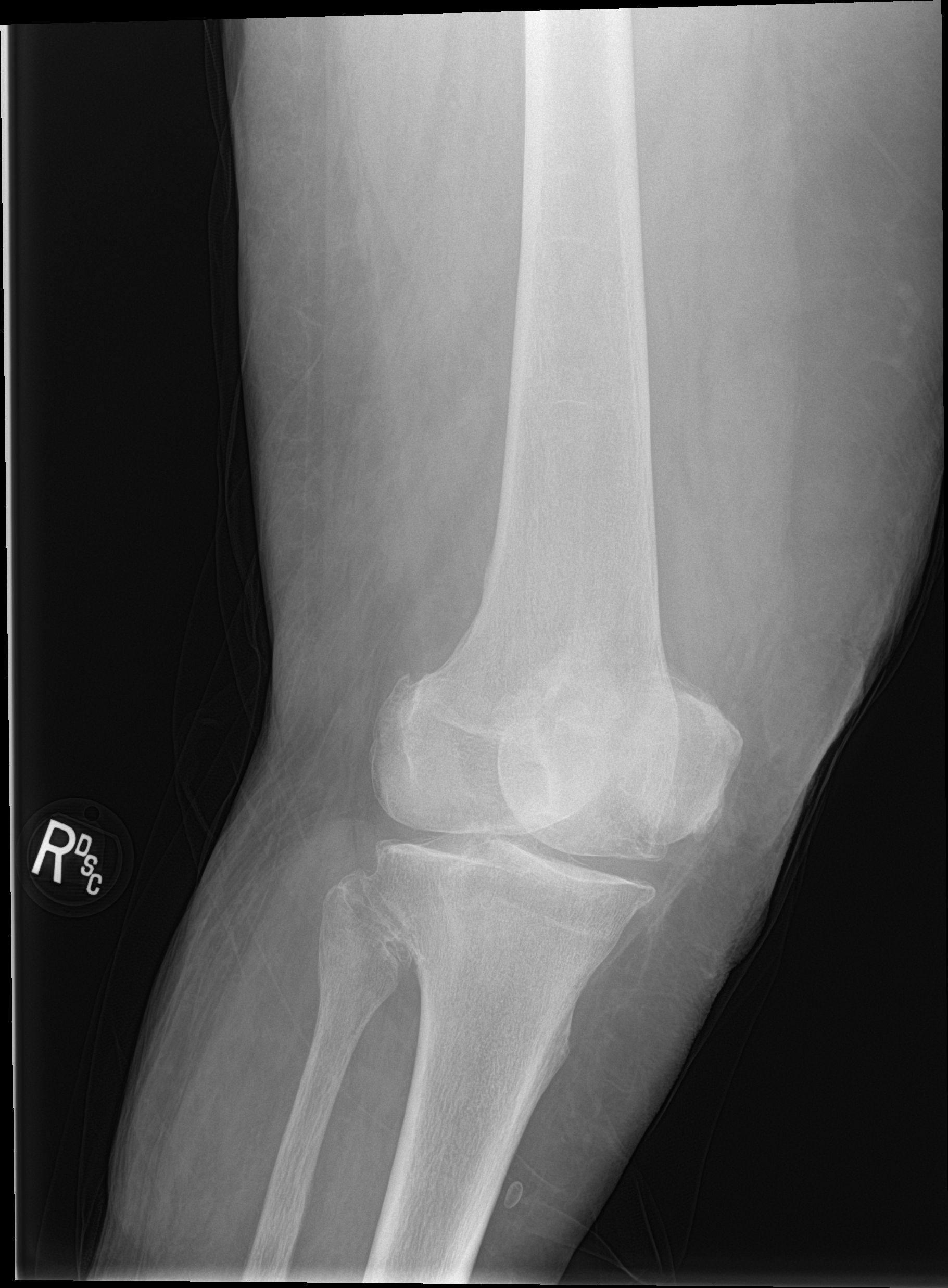

[4 of 4 positions shown; findings below may reference images not displayed]

FINDINGS: No fracture or dislocation. There is mild medial tibiofemoral joint
space narrowing. Tricompartmental peripheral spurring. No joint
effusion. Quadriceps and patellar tendon enthesophytes. Mild medial
soft tissue edema.
IMPRESSION: 1. Mild tricompartmental osteoarthritis without acute fracture or
subluxation.
2. Medial soft tissue edema.

## 2023-02-01 ENCOUNTER — Ambulatory Visit: Payer: Medicare Other | Admitting: Podiatry

## 2023-12-02 ENCOUNTER — Emergency Department (HOSPITAL_COMMUNITY)

## 2023-12-02 ENCOUNTER — Emergency Department (HOSPITAL_COMMUNITY)
Admission: EM | Admit: 2023-12-02 | Discharge: 2023-12-02 | Disposition: A | Discharge status: Home / Self Care | Attending: Emergency Medicine | Admitting: Emergency Medicine

## 2023-12-02 DIAGNOSIS — N3000 Acute cystitis without hematuria: Secondary | ICD-10-CM | POA: Insufficient documentation

## 2023-12-02 DIAGNOSIS — Z853 Personal history of malignant neoplasm of breast: Secondary | ICD-10-CM | POA: Insufficient documentation

## 2023-12-02 DIAGNOSIS — Z7401 Bed confinement status: Secondary | ICD-10-CM | POA: Diagnosis not present

## 2023-12-02 DIAGNOSIS — Z7982 Long term (current) use of aspirin: Secondary | ICD-10-CM | POA: Diagnosis not present

## 2023-12-02 DIAGNOSIS — E119 Type 2 diabetes mellitus without complications: Secondary | ICD-10-CM | POA: Insufficient documentation

## 2023-12-02 DIAGNOSIS — S0990XA Unspecified injury of head, initial encounter: Secondary | ICD-10-CM | POA: Diagnosis not present

## 2023-12-02 DIAGNOSIS — W19XXXA Unspecified fall, initial encounter: Secondary | ICD-10-CM | POA: Diagnosis not present

## 2023-12-02 DIAGNOSIS — R531 Weakness: Secondary | ICD-10-CM

## 2023-12-02 DIAGNOSIS — I10 Essential (primary) hypertension: Secondary | ICD-10-CM | POA: Insufficient documentation

## 2023-12-02 DIAGNOSIS — R6889 Other general symptoms and signs: Secondary | ICD-10-CM | POA: Diagnosis not present

## 2023-12-02 DIAGNOSIS — R0902 Hypoxemia: Secondary | ICD-10-CM | POA: Diagnosis not present

## 2023-12-02 DIAGNOSIS — R519 Headache, unspecified: Secondary | ICD-10-CM | POA: Diagnosis not present

## 2023-12-02 DIAGNOSIS — Z79899 Other long term (current) drug therapy: Secondary | ICD-10-CM | POA: Diagnosis not present

## 2023-12-02 DIAGNOSIS — Z743 Need for continuous supervision: Secondary | ICD-10-CM | POA: Diagnosis not present

## 2023-12-02 DIAGNOSIS — G930 Cerebral cysts: Secondary | ICD-10-CM | POA: Diagnosis not present

## 2023-12-02 LAB — HEPATIC FUNCTION PANEL
ALT: 30 U/L (ref 0–44)
AST: 52 U/L — ABNORMAL HIGH (ref 15–41)
Albumin: 3.9 g/dL (ref 3.5–5.0)
Alkaline Phosphatase: 68 U/L (ref 38–126)
Bilirubin, Direct: 0.1 mg/dL (ref 0.0–0.2)
Indirect Bilirubin: 0.5 mg/dL (ref 0.3–0.9)
Total Bilirubin: 0.6 mg/dL (ref 0.0–1.2)
Total Protein: 7 g/dL (ref 6.5–8.1)

## 2023-12-02 LAB — URINALYSIS, ROUTINE W REFLEX MICROSCOPIC
Bilirubin Urine: NEGATIVE
Glucose, UA: NEGATIVE mg/dL
Ketones, ur: 5 mg/dL — AB
Leukocytes,Ua: NEGATIVE
Nitrite: POSITIVE — AB
Protein, ur: 30 mg/dL — AB
Specific Gravity, Urine: 1.023 (ref 1.005–1.030)
pH: 5 (ref 5.0–8.0)

## 2023-12-02 LAB — CBC
HCT: 46.2 % — ABNORMAL HIGH (ref 36.0–46.0)
Hemoglobin: 14.7 g/dL (ref 12.0–15.0)
MCH: 30.9 pg (ref 26.0–34.0)
MCHC: 31.8 g/dL (ref 30.0–36.0)
MCV: 97.1 fL (ref 80.0–100.0)
Platelets: 256 10*3/uL (ref 150–400)
RBC: 4.76 MIL/uL (ref 3.87–5.11)
RDW: 13.7 % (ref 11.5–15.5)
WBC: 8.7 10*3/uL (ref 4.0–10.5)
nRBC: 0 % (ref 0.0–0.2)

## 2023-12-02 LAB — BASIC METABOLIC PANEL
Anion gap: 14 (ref 5–15)
BUN: 30 mg/dL — ABNORMAL HIGH (ref 8–23)
CO2: 22 mmol/L (ref 22–32)
Calcium: 8.8 mg/dL — ABNORMAL LOW (ref 8.9–10.3)
Chloride: 104 mmol/L (ref 98–111)
Creatinine, Ser: 0.94 mg/dL (ref 0.44–1.00)
GFR, Estimated: 59 mL/min — ABNORMAL LOW (ref >60)
Glucose, Bld: 130 mg/dL — ABNORMAL HIGH (ref 70–99)
Potassium: 3.5 mmol/L (ref 3.5–5.1)
Sodium: 140 mmol/L (ref 135–145)

## 2023-12-02 LAB — CBG MONITORING, ED: Glucose-Capillary: 135 mg/dL — ABNORMAL HIGH (ref 70–99)

## 2023-12-02 LAB — CK: Total CK: 809 U/L — ABNORMAL HIGH (ref 38–234)

## 2023-12-02 MED ORDER — CEPHALEXIN 500 MG PO CAPS: 500.0000 mg | ORAL_CAPSULE | Freq: Four times a day (QID) | ORAL | 0 refills | Status: AC

## 2023-12-02 NOTE — ED Provider Notes (Addendum)
 [place] [facility]ERGENCY DEPARTMENT AT [facility] Provider Note   CSN: [id number] Arrival date & time: 12/02/23  0730     History  No chief complaint on file.   Lorraine Murphy is a 89 y.o. female.  HPI Patient presents after fall generalized weakness.  Reportedly had a fall yesterday and was unable to get back up into her wheelchair.  States she normally would not be able to get back up.  Denies any injury.  States she just feels a little weak all over.  Had some diarrhea and vomiting yesterday.  Patient is incontinent at baseline.   Past Medical History:  Diagnosis Date   AKI (acute kidney injury) (HCC) 03/07/2017   Anxiety    Arthritis    Bilateral cataracts    no surgery   Breast cancer (HCC) 02/29/12   right breast lumpectomy=invasive ductal cs ,ER?PR=positive her2 neu=neg   Cancer (HCC)    Colon polyp    Depression    Diabetes mellitus    diet contolled   GERD (gastroesophageal reflux disease)    H/O bone density study    H/O colonoscopy 2012   Hematuria    Hyperlipidemia    Hypertension    Incontinence    Obesity    Osteoarthritis    Schizophrenia (HCC) 41 age   paranoid schzophrenia   Skin cancer    SUI (stress urinary incontinence, female)    Wears glasses     Home Medications Prior to Admission medications   Medication Sig Start Date End Date Taking? Authorizing Provider  cephALEXin (KEFLEX) 500 MG capsule Take 1 capsule (500 mg total) by mouth 4 (four) times daily. 12/02/23  Yes Benjiman Core, MD  amLODipine (NORVASC) 5 MG tablet Take 5 mg by mouth daily.    [provider]  anastrozole (ARIMIDEX) 1 MG tablet TAKE 1 TABLET (1 MG TOTAL) BY MOUTH DAILY. Patient not taking: No sig reported 12/07/15   Magrinat, Valentino Hue, MD  aspirin 81 MG tablet Take 81 mg by mouth daily.    [provider]  atorvastatin (LIPITOR) 40 MG tablet Take 40 mg by mouth daily.    [provider]  buPROPion (WELLBUTRIN XL) 300 MG 24 hr tablet Take  300 mg by mouth daily.     [provider]  haloperidol decanoate (HALDOL DECANOATE) 100 MG/ML injection Inject 100 mg into the muscle every 30 (thirty) days.    [provider]  meclizine (ANTIVERT) 25 MG tablet Take 1 tablet (25 mg total) by mouth 3 (three) times daily as needed for dizziness. 04/08/21   Bethann Berkshire, MD  meloxicam (MOBIC) 15 MG tablet Take 15 mg by mouth daily.    [provider]  naproxen sodium (ALEVE) 220 MG tablet Take 220-440 mg by mouth 2 (two) times daily as needed (for pain).    [provider]  ondansetron (ZOFRAN ODT) 4 MG disintegrating tablet 4mg  ODT q4 hours prn nausea/vomit Patient taking differently: Take 4 mg by mouth every 4 (four) hours as needed for vomiting or nausea (dissolve orally). 04/08/21   Bethann Berkshire, MD  OXYGEN Inhale 2 L/min into the lungs at bedtime.    [provider]  REFRESH TEARS 0.5 % SOLN Place 1 drop into both eyes 3 (three) times daily as needed (for dryness).    [provider]  sertraline (ZOLOFT) 50 MG tablet Take 50 mg by mouth daily.    [provider]  vitamin B-12 (CYANOCOBALAMIN) 50 MCG tablet Take  50 mcg by mouth daily.    [provider]      Allergies    Sertraline    Review of Systems   Review of Systems  Physical Exam Updated Vital Signs BP (!) 162/76   Pulse 75   Temp 98 F (36.7 C)   Resp (!) 22   Ht 5\' 3"  (1.6 m)   SpO2 94%   BMI 42.96 kg/m  Physical Exam Vitals and nursing note reviewed.  HENT:     Head:     Comments: Mild occipital tenderness. Cardiovascular:     Rate and Rhythm: Regular rhythm.  Pulmonary:     Breath sounds: No wheezing.  Abdominal:     Tenderness: There is no abdominal tenderness.  Musculoskeletal:        General: No tenderness.  Skin:    General: Skin is warm.  Neurological:     Mental Status: She is alert and oriented to person, place, and time.     ED Results / Procedures / Treatments   Labs (all  labs ordered are listed, but only abnormal results are displayed) Labs Reviewed  BASIC METABOLIC PANEL - Abnormal; Notable for the following components:      Result Value   Glucose, Bld 130 (*)    BUN 30 (*)    Calcium 8.8 (*)    GFR, Estimated 59 (*)    All other components within normal limits  CBC - Abnormal; Notable for the following components:   HCT 46.2 (*)    All other components within normal limits  URINALYSIS, ROUTINE W REFLEX MICROSCOPIC - Abnormal; Notable for the following components:   APPearance HAZY (*)    Hgb urine dipstick SMALL (*)    Ketones, ur 5 (*)    Protein, ur 30 (*)    Nitrite POSITIVE (*)    Bacteria, UA MANY (*)    All other components within normal limits  HEPATIC FUNCTION PANEL - Abnormal; Notable for the following components:   AST 52 (*)    All other components within normal limits  CK - Abnormal; Notable for the following components:   Total CK 809 (*)    All other components within normal limits  CBG MONITORING, ED - Abnormal; Notable for the following components:   Glucose-Capillary 135 (*)    All other components within normal limits  URINE CULTURE    EKG EKG Interpretation Date/Time:  Saturday December 02 2023 07:40:56 EST Ventricular Rate:  79 PR Interval:  217 QRS Duration:  102 QT Interval:  455 QTC Calculation: 525 R Axis:   -17  Text Interpretation: Sinus or ectopic atrial rhythm Borderline prolonged PR interval Borderline left axis deviation Nonspecific T abnormalities, diffuse leads Prolonged QT interval Confirmed by Benjiman Core [phone]) on 12/02/2023 11:35:05 AM  Radiology CT HEAD WO CONTRAST ( ) Result Date: 12/02/2023 CLINICAL DATA:  Minor head trauma EXAM: CT HEAD WITHOUT CONTRAST TECHNIQUE: Contiguous axial images were obtained from the base of the skull through the vertex without intravenous contrast. RADIATION DOSE REDUCTION: This exam was performed according to the departmental dose-optimization program which  includes automated exposure control, adjustment of the mA and/or kV according to patient size and/or use of iterative reconstruction technique. COMPARISON:  05/30/2021 FINDINGS: Brain: No evidence of acute infarction, hemorrhage, hydrocephalus, or mass lesion. Arachnoid cyst at the left middle cranial fossa with mild temporal lobe mass effect, mass effect diminished due to the degree of brain atrophy which is specially affects the frontal lobes. Vascular:  No hyperdense vessel or unexpected calcification. Skull: Normal. Negative for fracture or focal lesion. Sinuses/Orbits: No evidence of injury Other: Motion artifact which is a significant limitation at upper slices where pathology could be obscured. IMPRESSION: 1. Moderate motion artifact affecting the upper brain. 2. No evidence of injury. Electronically Signed   By: Tiburcio Pea M.D.   On: 12/02/2023 12:40    Procedures Procedures    Medications Ordered in ED Medications - No data to display  ED Course/ Medical Decision Making/ A&P                                 Medical Decision Making Amount and/or Complexity of Data Reviewed Labs: ordered. Radiology: ordered.  Risk Prescription drug management.  Patient with increasing weakness and fall.  Baseline weakness.  Will get urinalysis since reportedly has been more weak and is incontinent at baseline.  States she does have incontinence but denies burning.  Did hit head and will get CT scan of head.  Will also get CK to evaluate for rhabdo since she was on the ground.  CK slightly elevated at 800 but still well under 5000.  Head CT reassuring.  Blood work otherwise reassuring.  Urinalysis still pending.  Hopefully should be able to discharge home  Urine shows potential infection.  With her increasing frequency and incontinence I think it is reasonable to treat.  Culture sent.  Appears stable for discharge home however.        Final Clinical Impression(s) / ED Diagnoses Final  diagnoses:  Weakness  Acute cystitis without hematuria    Rx / DC Orders ED Discharge Orders          Ordered    cephALEXin (KEFLEX) 500 MG capsule  4 times daily        12/02/23 1511              Benjiman Core, MD 12/02/23 1505    Benjiman Core, MD 12/02/23 [phone]

## 2023-12-02 NOTE — ED Notes (Signed)
 Writer contacted [facility] for transport back to facility

## 2023-12-02 NOTE — ED Triage Notes (Signed)
 Patient BIB [facility] Patient had fall this morning Unsure how long down, patient found at 645 am Ems reports sore spot on back of head No hematoma Patient doesn't know how she fell or when Patient had fall yesterday also around 4 pm but was able to get self back up EMS reports facility stated patient had diarrhea and vomiting yesterday   EMS states patient denies pain Incontinent of b/b States patient smells of UTI

## 2023-12-04 LAB — URINE CULTURE: Culture: >=100000 — AB

## 2023-12-05 ENCOUNTER — Telehealth (HOSPITAL_BASED_OUTPATIENT_CLINIC_OR_DEPARTMENT_OTHER): Payer: Self-pay

## 2023-12-05 NOTE — Telephone Encounter (Signed)
 Post ED Visit - Positive Culture Follow-up  Culture report reviewed by antimicrobial stewardship pharmacist: [facility] []  Nathan Batchelder, Pharm.D. []  Celedonio Miyamoto, Pharm.D., BCPS AQ-ID []  Garvin Fila, Pharm.D., BCPS []  Georgina Pillion, Pharm.D., BCPS []  [place], [address].D., BCPS, AAHIVP []  Estella Husk, Pharm.D., BCPS, AAHIVP []  Lysle Pearl, PharmD, BCPS []  Phillips Climes, PharmD, BCPS []  Agapito Games, PharmD, BCPS []  Verlan Friends, PharmD []  Mervyn Gay, PharmD, BCPS []  Vinnie Level, PharmD  [facility] [x]  Georgina Pillion, PharmD []  Greer Pickerel, PharmD []  Adalberto Cole, PharmD []  Perlie Gold, Rph []  Lonell Face) Jean Rosenthal, PharmD []  Earl Many, PharmD []  Junita Push, PharmD []  Dorna Leitz, PharmD []  Terrilee Files, PharmD []  Lynann Beaver, PharmD []  Keturah Barre, PharmD []  Loralee Pacas, PharmD []  Bernadene Person, PharmD   Positive urine culture Treated with Cephalexin, organism sensitive to the same and no further patient follow-up is required at this time.  Sandria Senter 12/05/2023, 11:50 AM

## 2023-12-19 DIAGNOSIS — N390 Urinary tract infection, site not specified: Secondary | ICD-10-CM | POA: Diagnosis not present

## 2023-12-19 DIAGNOSIS — E039 Hypothyroidism, unspecified: Secondary | ICD-10-CM | POA: Diagnosis not present

## 2024-01-31 ENCOUNTER — Telehealth: Payer: Self-pay

## 2024-01-31 NOTE — Telephone Encounter (Signed)
 Copied from CRM [id number]. Topic: General - Other >> Jan 30, 2024  4:31 PM Sophia H wrote: Reason for CRM: Patient is calling in reference to medications that were not listed. (Diltiazem,Losartan, levetheroxin) Pt stated she had a visit today with [facility] but after looking in her chart no visits were found, could not provide name of clinic either but wanted to make sure medications were ordered and sent over to pharmacy on file.

## 2024-02-06 DIAGNOSIS — Z1389 Encounter for screening for other disorder: Secondary | ICD-10-CM | POA: Diagnosis not present

## 2024-02-09 DIAGNOSIS — H40013 Open angle with borderline findings, low risk, bilateral: Secondary | ICD-10-CM | POA: Diagnosis not present

## 2024-02-09 DIAGNOSIS — H524 Presbyopia: Secondary | ICD-10-CM | POA: Diagnosis not present

## 2024-02-09 DIAGNOSIS — H01004 Unspecified blepharitis left upper eyelid: Secondary | ICD-10-CM | POA: Diagnosis not present

## 2024-02-09 DIAGNOSIS — E119 Type 2 diabetes mellitus without complications: Secondary | ICD-10-CM | POA: Diagnosis not present

## 2024-02-09 DIAGNOSIS — H01001 Unspecified blepharitis right upper eyelid: Secondary | ICD-10-CM | POA: Diagnosis not present

## 2024-04-01 DIAGNOSIS — I10 Essential (primary) hypertension: Secondary | ICD-10-CM | POA: Diagnosis not present

## 2024-04-01 DIAGNOSIS — E039 Hypothyroidism, unspecified: Secondary | ICD-10-CM | POA: Diagnosis not present

## 2024-04-01 DIAGNOSIS — E1121 Type 2 diabetes mellitus with diabetic nephropathy: Secondary | ICD-10-CM | POA: Diagnosis not present

## 2024-04-15 DIAGNOSIS — H01004 Unspecified blepharitis left upper eyelid: Secondary | ICD-10-CM | POA: Diagnosis not present

## 2024-04-15 DIAGNOSIS — H01001 Unspecified blepharitis right upper eyelid: Secondary | ICD-10-CM | POA: Diagnosis not present

## 2024-04-15 DIAGNOSIS — B880 Other acariasis: Secondary | ICD-10-CM | POA: Diagnosis not present

## 2024-05-02 DIAGNOSIS — E039 Hypothyroidism, unspecified: Secondary | ICD-10-CM | POA: Diagnosis not present

## 2024-05-02 DIAGNOSIS — I10 Essential (primary) hypertension: Secondary | ICD-10-CM | POA: Diagnosis not present

## 2024-05-02 DIAGNOSIS — E1121 Type 2 diabetes mellitus with diabetic nephropathy: Secondary | ICD-10-CM | POA: Diagnosis not present

## 2024-06-02 DIAGNOSIS — E1121 Type 2 diabetes mellitus with diabetic nephropathy: Secondary | ICD-10-CM | POA: Diagnosis not present

## 2024-06-02 DIAGNOSIS — I10 Essential (primary) hypertension: Secondary | ICD-10-CM | POA: Diagnosis not present

## 2024-06-02 DIAGNOSIS — E039 Hypothyroidism, unspecified: Secondary | ICD-10-CM | POA: Diagnosis not present

## 2024-07-02 DIAGNOSIS — E039 Hypothyroidism, unspecified: Secondary | ICD-10-CM | POA: Diagnosis not present

## 2024-07-02 DIAGNOSIS — E1121 Type 2 diabetes mellitus with diabetic nephropathy: Secondary | ICD-10-CM | POA: Diagnosis not present

## 2024-07-02 DIAGNOSIS — I10 Essential (primary) hypertension: Secondary | ICD-10-CM | POA: Diagnosis not present
